# Patient Record
Sex: Female | Born: 1980
Health system: Southern US, Community
[De-identification: ages and names within clinical notes are randomized; demographics above are authoritative.]

## PROBLEM LIST (undated history)

## (undated) ENCOUNTER — Inpatient Hospital Stay (HOSPITAL_COMMUNITY): Payer: Self-pay

## (undated) DIAGNOSIS — Z789 Other specified health status: Secondary | ICD-10-CM

## (undated) HISTORY — DX: Other specified health status: Z78.9

## (undated) HISTORY — PX: NO PAST SURGERIES: SHX2092

---

## 2004-06-06 ENCOUNTER — Other Ambulatory Visit: Admission: RE | Admit: 2004-06-06 | Discharge: 2004-06-06 | Payer: Self-pay | Admitting: Obstetrics and Gynecology

## 2005-04-30 ENCOUNTER — Other Ambulatory Visit: Admission: RE | Admit: 2005-04-30 | Discharge: 2005-04-30 | Payer: Self-pay | Admitting: Obstetrics and Gynecology

## 2007-08-26 ENCOUNTER — Ambulatory Visit (HOSPITAL_COMMUNITY): Admission: RE | Admit: 2007-08-26 | Discharge: 2007-08-26 | Payer: Self-pay | Admitting: Obstetrics and Gynecology

## 2007-11-17 ENCOUNTER — Inpatient Hospital Stay (HOSPITAL_COMMUNITY): Admission: AD | Admit: 2007-11-17 | Discharge: 2007-11-19 | Payer: Self-pay | Admitting: Obstetrics and Gynecology

## 2009-11-29 IMAGING — US US OB DETAIL+14 WK
1 series · 18 of 28 positions shown · non-contrast
Comparison: none

OBSTETRICAL ULTRASOUND:
 This ultrasound was performed in The [HOSPITAL], and the [facility] report will be stored to [REDACTED] PACS.

[Series 1: us ob detail+14 wk · 18 of 64 slices shown]
[im 1/64]
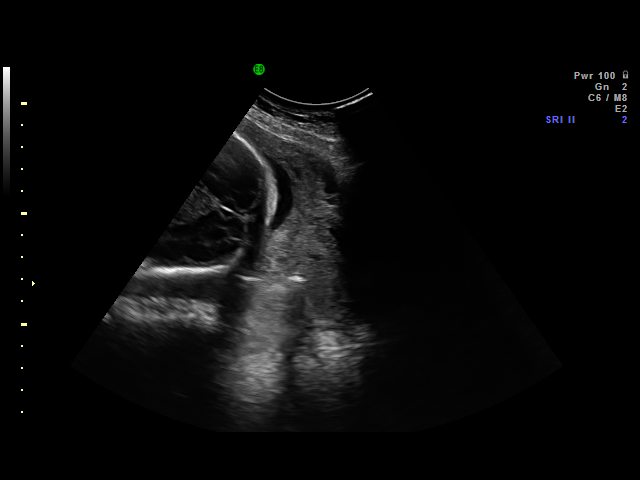
[im 5/64]
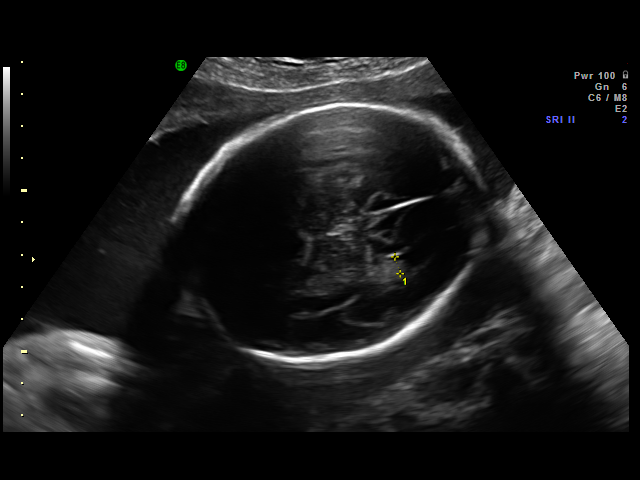
[im 8/64]
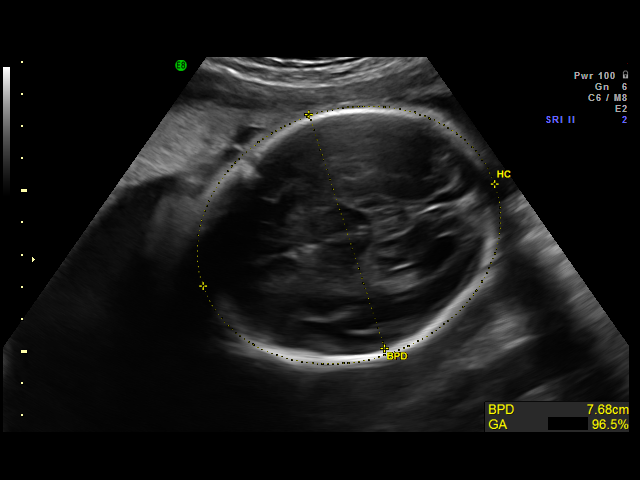
[im 12/64]
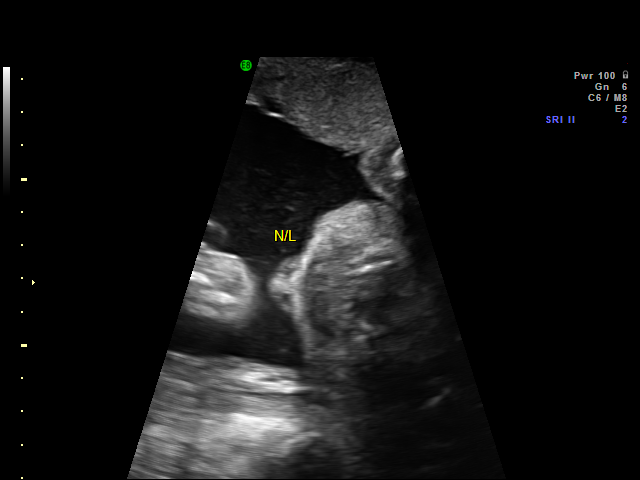
[im 17/64]
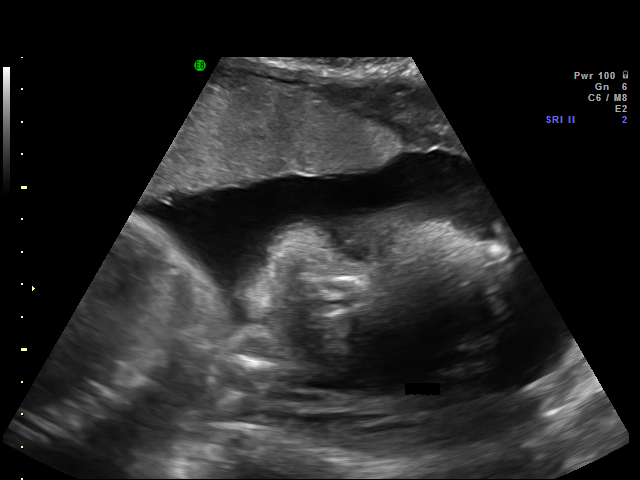
[im 19/64]
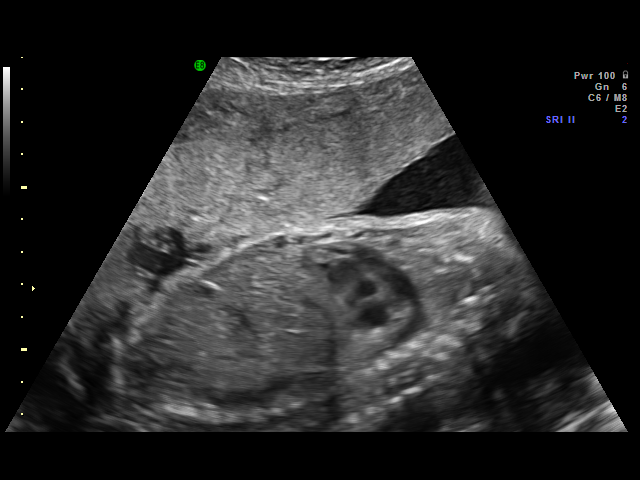
[im 24/64]
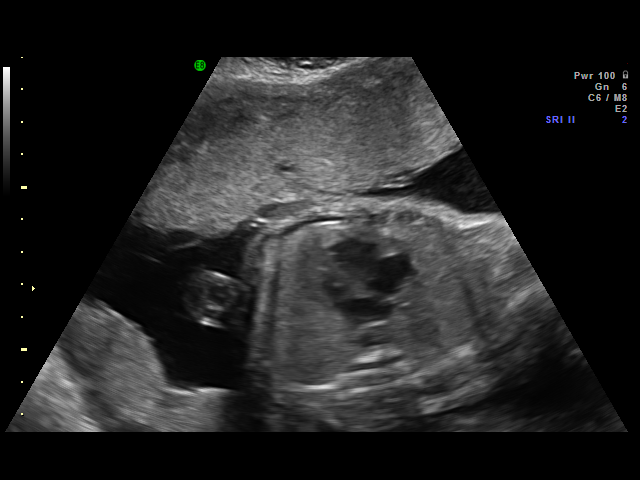
[im 26/64]
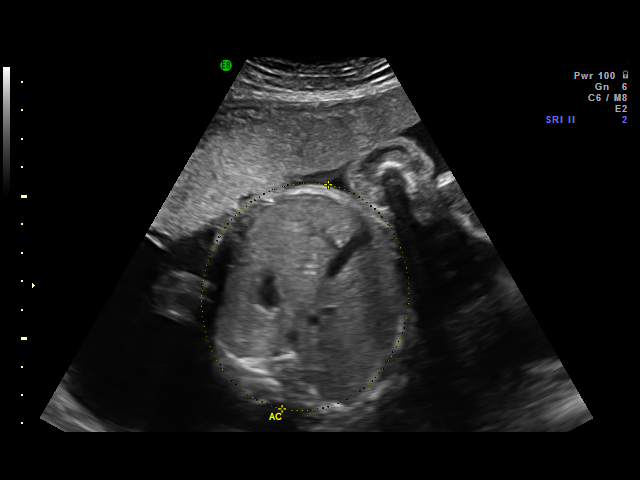
[im 31/64]
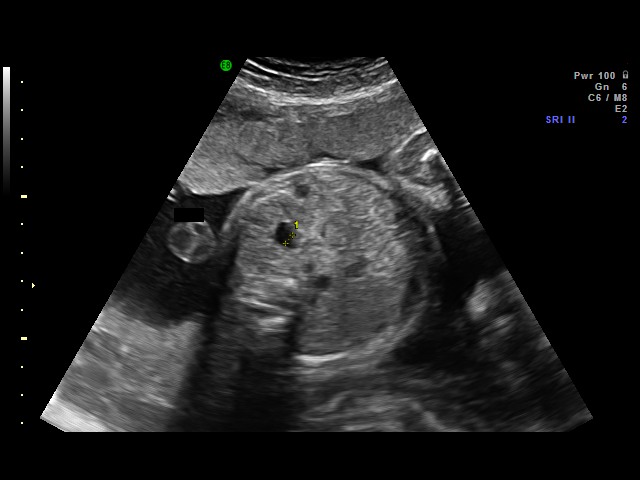
[im 33/64]
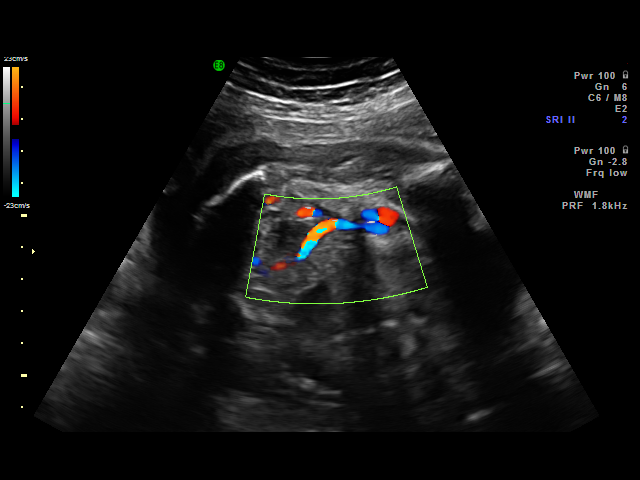
[im 38/64]
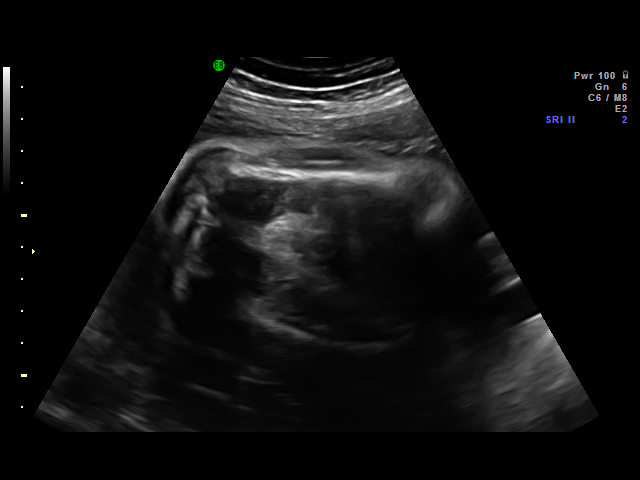
[im 40/64]
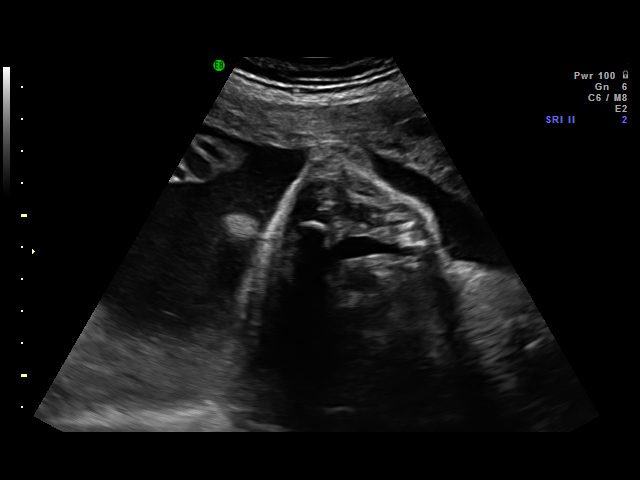
[im 45/64]
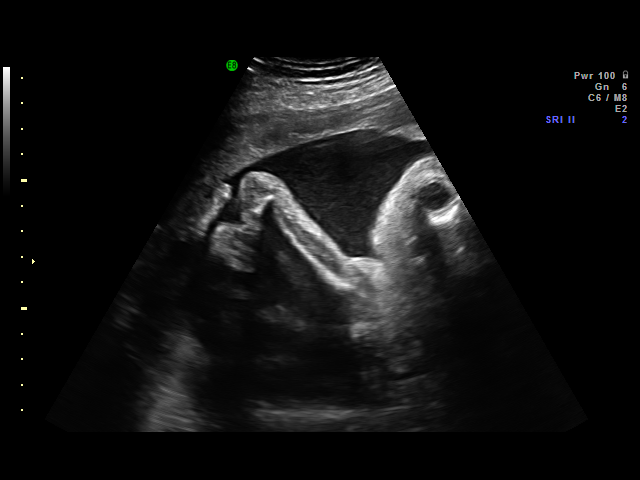
[im 50/64]
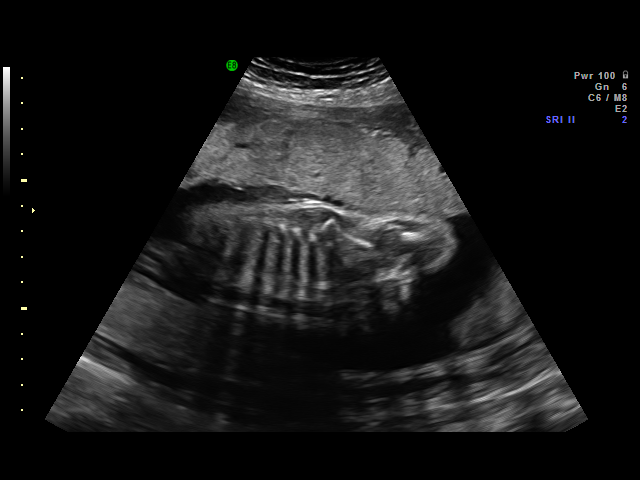
[im 52/64]
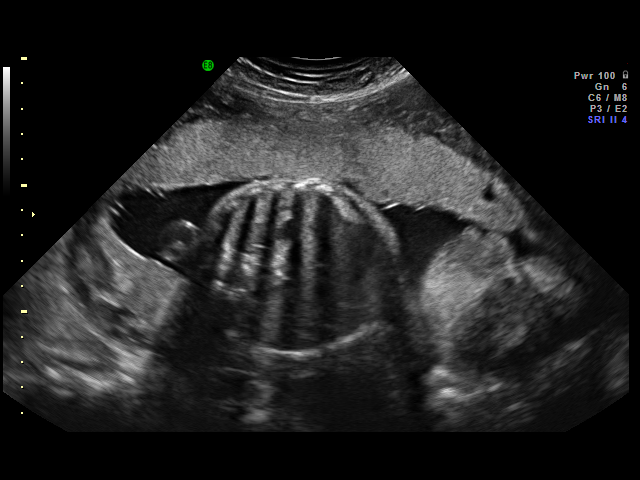
[im 57/64]
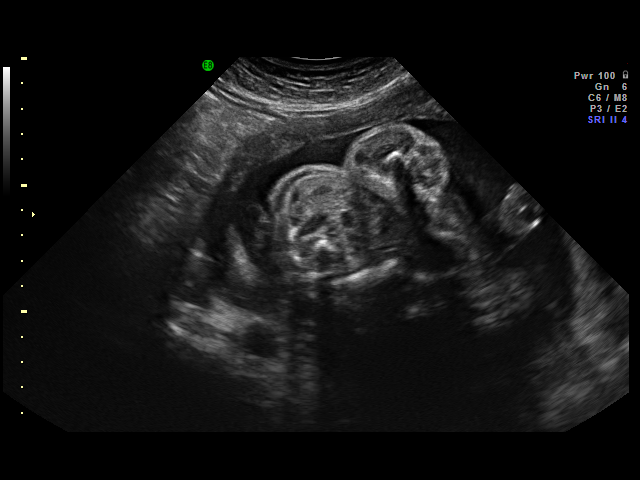
[im 59/64]
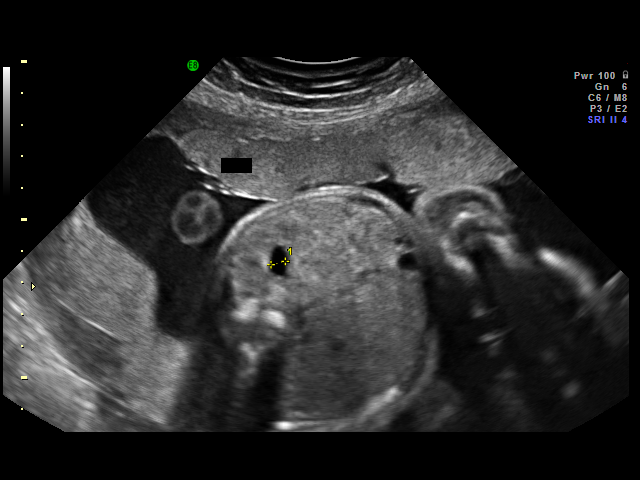
[im 64/64]
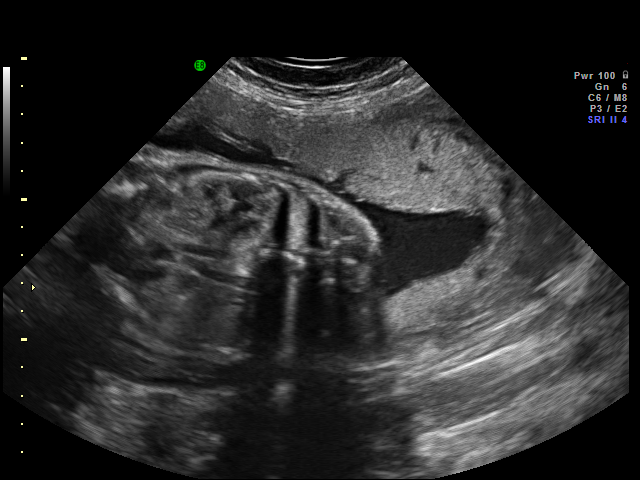

[18 of 28 positions shown; findings below may reference images not displayed]

IMPRESSION: [facility] has also been faxed to the ordering physician.

## 2010-08-01 NOTE — Discharge Summary (Signed)
NAMEBREANNAH, Paula Wilkins                   ACCOUNT NO.:  [id number]   MEDICAL RECORD NO.:  [id number]          PATIENT TYPE:  INP   LOCATION:  9124                          FACILITY:  [facility]   PHYSICIAN:  Malachi Pro. Ambrose Mantle, M.D. DATE OF BIRTH:  [birth year]   DATE OF ADMISSION:  11/17/2007  DATE OF DISCHARGE:  11/19/2007                               DISCHARGE SUMMARY   HISTORY OF PRESENT ILLNESS:  A 30 year old white married female para 0,  gravida 1, [facility] November 16, 2007, admitted with premature rupture of the  membranes.  Blood group and type O positive, negative antibody,  nonreactive serology, rubella immune, hepatitis B surface antigen  negative, HIV negative, GC and chlamydia negative, 1-hour Glucola 129,  group B strep negative, AFP negative.  Ultrasound on March 31, 2007,  crown-rump length 9.4 mm, 7 weeks 0 days, [facility] November 17, 2007.  Ultrasound on April 09, 2007, crown-rump length 19.1 mm, 8 weeks 3  days, [facility] November 16, 2007.  Ultrasound on June 23, 2007, average  gestational age [redacted] weeks 3 days, [facility] November 14, 2007.  Right kidney  could not be seen.  Left kidney had mild dilatation of the collecting  system.  Ultrasound on Jul 25, 2007 showed a left renal pelvis that was  upper limit of normal, right kidney hard to see, questionable  dysplastic.  [facility]ound on August 26, 2007 [facility] showed  the left kidney to be normal and the right kidney was absent.  Ultrasound on November 03, 2007 showed normal growth, succenturiate lobe  of the placenta.  The patient had spontaneous rupture of membranes on  the morning of admission, no regular contractions.   PAST MEDICAL HISTORY:  Showed no known allergies.  No operations.  No  illnesses.   FAMILY HISTORY:  Maternal grandfather with COPD, paternal uncle colon  cancer.   SOCIAL HISTORY:  Alcohol, tobacco, and drugs, none.   OBSTETRIC HISTORY:  None.   PHYSICAL EXAMINATION:  On admission revealed normal vital signs.  Heart  and  lungs were normal.  The abdomen was soft.  Fetal heart tones were  normal.  Cervix by the nurses' exam was 1-2 cm, 50% vertex at -2 to -3  with clear fluid.  The patient was begun on Pitocin.  She was observed  for progress.  At 1:35 p.m., the cervix was 4 cm, 95% vertex at -2.  At  6:07 p.m., she was 10 cm at +1 station.  She began pushing at  approximately 6:50 p.m.  She pushed well, but descent was slow.  At 7:47  p.m., there was 2-3 cm caput visible with pushing.  The vertex felt LOT  to LOA.  Contractions remained every 2-3 minutes on 13 milliunits a  minute of Pitocin.  She tried pulling on her thighs, on a sheet and on  handles.  She liked the handles best.  Fetal heart rate was normal until  approximately 7:20 p.m., when she began having some decelerations, some  U-shaped after contractions, but at 8:40 p.m., the fetal heart rate was  reassuring.  After 3 hours of pushing, she was placed in stirrups.  She  was able to show 5-6 cm of caput with pushing.  A MultiVac vacuum was  applied with the vertex LOT to LOA and with 2 contractions and no  popoffs.  A living female infant 7 pounds 15 ounces with Apgars of 4 at  one and 9 at five minutes was delivered LOA over bilateral labial deep  left sulcus and second-degree midline lacerations.  Meconium was present  and the DeLee and bulb suction were done with the head on the perineum.  There was no shoulder dystocia.  All lacerations were repaired with 3-0  Vicryl.  A hematoma in the right distal vagina was opened and drained.  Blood loss about 500 mL.  Rectal was negative.  Post partum, the patient  did well and was discharged on the second postpartum day.  Initial  hemoglobin 14.6, hematocrit 43.5, white count 13,300, and platelet count  188,000.  RPR nonreactive.  Followup hemoglobin 11.9, hematocrit 35.1,  and white count 27,900.   FINAL DIAGNOSES:  Intrauterine pregnancy at 40 weeks and 1 day with  premature rupture of membranes,  delivered LOA with vacuum assistance,  prolonged second stage of labor.   OPERATION:  Vacuum-assisted vaginal delivery left occipitoanterior,  repair of bilateral labial, deep left sulcus, and second-degree midline  lacerations.   FINAL CONDITION:  Improved.   INSTRUCTIONS:  Include a regular discharge instruction booklet.  The  patient is advised to return in 6 weeks.  Percocet and Motrin are given  at discharge, 30 tablets of each.      Malachi Pro. Ambrose Mantle, M.D.  Electronically Signed     TFH/MEDQ  D:  11/19/2007  T:  11/20/2007  Job:  [id number]

## 2010-12-20 LAB — CBC
HCT: 35.1 — ABNORMAL LOW
MCV: 89.3
RBC: 3.93
WBC: 27.9 — ABNORMAL HIGH

## 2013-12-26 ENCOUNTER — Telehealth: Payer: Self-pay | Admitting: Nurse Practitioner

## 2013-12-26 DIAGNOSIS — J0111 Acute recurrent frontal sinusitis: Secondary | ICD-10-CM

## 2013-12-26 MED ORDER — AMOXICILLIN-POT CLAVULANATE 875-125 MG PO TABS: 1.0000 | ORAL_TABLET | Freq: Two times a day (BID) | ORAL | Status: DC

## 2013-12-26 NOTE — Progress Notes (Signed)

## 2014-01-01 ENCOUNTER — Other Ambulatory Visit: Payer: Self-pay

## 2014-01-31 ENCOUNTER — Telehealth: Payer: Self-pay | Admitting: Physician Assistant

## 2014-01-31 DIAGNOSIS — J019 Acute sinusitis, unspecified: Principal | ICD-10-CM

## 2014-01-31 DIAGNOSIS — B9689 Other specified bacterial agents as the cause of diseases classified elsewhere: Secondary | ICD-10-CM

## 2014-01-31 MED ORDER — AMOXICILLIN-POT CLAVULANATE 875-125 MG PO TABS: 1.0000 | ORAL_TABLET | Freq: Two times a day (BID) | ORAL | Status: DC

## 2014-01-31 NOTE — Progress Notes (Signed)
We are sorry that you are not feeling well.  Here is how we plan to help!  Based on what you have shared with me it looks like you have sinusitis.  Sinusitis is inflammation and infection in the sinus cavities of the head.  Based on your presentation I believe you most likely have Acute Bacterial sinusitis.  This is an infection caused by bacteria and is treated with antibiotics.  I have prescribed Augmentin, an antibiotic in the penicillin family, one tablet twice daily with food, for 10 days.  You may use an oral decongestant such as Mucinex D or if you have glaucoma or high blood pressure use plain Mucinex.  Saline nasal sprays help an can sefely be used as often as needed for congestion.  If you develop worsening sinus pain, fever or notice severe headache and vision changes, or if symptoms are not better after completion of antibiotic, please schedule an appointment with a health care provider.  Sinus infections are not as easily transmitted as other respiratory infection, however we still recommend that you avoid close contact with loved ones, especially the very young and elderly.  Remember to wash your hands thoroughly throughout the day as this is the number one way to prevent the spread of infection!  Home Care:  Only take medications as instructed by your medical team.  Complete the entire course of an antibiotic.  Do not take these medications with alcohol.  A steam or ultrasonic humidifier can help congestion.  You can place a towel over your head and breathe in the steam from hot water coming from a faucet.  Avoid close contacts especially the very young and the elderly.  Cover your mouth when you cough or sneeze.  Always remember to wash your hands.  Get Help Right Away If:  You develop worsening fever or sinus pain.  You develop a severe head ache or visual changes.  Your symptoms persist after you have completed your treatment plan.  Make sure you  Understand these  instructions.  Will watch your condition.  Will get help right away if you are not doing well or get worse.  Your e-visit answers were reviewed by a board certified advanced clinical practitioner to complete your personal care plan.  Depending on the condition, your plan could have included both over the counter or prescription medications.  Please review your pharmacy choice.  If there is a problem, you may call our nursing hot line at [phone] and have the prescription routed to another pharmacy.  Your safety is important to [place].  If you have drug allergies check your prescription carefully.    You can use MyChart to ask questions about today's visit, request a non-urgent call back, or ask for a work or school excuse.  You will get an e-mail in the next two days asking about your experience.  I hope that your e-visit has been valuable and will speed your recovery. Thank you for using e-visits.

## 2014-02-02 ENCOUNTER — Emergency Department (HOSPITAL_COMMUNITY)
Admission: EM | Admit: 2014-02-02 | Discharge: 2014-02-02 | Disposition: A | Discharge status: Home / Self Care | Payer: 59 | Source: Home / Self Care | Attending: Emergency Medicine | Admitting: Emergency Medicine

## 2014-02-02 ENCOUNTER — Encounter (HOSPITAL_COMMUNITY): Payer: Self-pay | Admitting: Emergency Medicine

## 2014-02-02 DIAGNOSIS — J029 Acute pharyngitis, unspecified: Secondary | ICD-10-CM

## 2014-02-02 LAB — POCT RAPID STREP A: Streptococcus, Group A Screen (Direct): NEGATIVE

## 2014-02-02 LAB — POCT INFECTIOUS MONO SCREEN: MONO SCREEN: NEGATIVE

## 2014-02-02 MED ORDER — CLINDAMYCIN HCL 300 MG PO CAPS: 300.0000 mg | ORAL_CAPSULE | Freq: Four times a day (QID) | ORAL | Status: DC

## 2014-02-02 MED ORDER — PREDNISONE 20 MG PO TABS: 20.0000 mg | ORAL_TABLET | Freq: Two times a day (BID) | ORAL | Status: DC

## 2014-02-02 MED ORDER — OSELTAMIVIR PHOSPHATE 75 MG PO CAPS: 75.0000 mg | ORAL_CAPSULE | Freq: Two times a day (BID) | ORAL | Status: DC

## 2014-02-02 NOTE — Discharge Instructions (Signed)

## 2014-02-02 NOTE — ED Provider Notes (Signed)
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 She denies any headache, cough, or GI symptoms. Her son was recently diagnosed as having infectious mononucleosis. She did an E visit through My Chart this past Saturday, 3 days ago, and was prescribed Augmentin, but does not feel any better.   Review of Systems   Other than as noted above, the patient denies any of the following symptoms. Systemic:  No fever, chills, sweats, myalgias, or headache. Eye:  No redness, pain or drainage. ENT:  No earache, nasal congestion, sneezing, rhinorrhea, sinus pressure, sinus pain, or post nasal drip. Lungs:  No cough, sputum production, wheezing, shortness of breath, or chest pain. GI:  No abdominal pain, nausea, vomiting, or diarrhea. Skin:  No rash.  [place]   Past medical history, family history, social history, meds, and allergies were reviewed.   Physical Exam     Vital signs:  BP 118/76 mmHg  Pulse 113  Temp(Src) 101.6 F (38.7 C) (Oral)  Resp 12  SpO2 100%  LMP 12/29/2013 General:  Alert, in no distress. Phonation was normal, no drooling, and patient was able to handle secretions well.  Eye:  No conjunctival injection or drainage. Lids were normal. ENT:  TMs and canals were normal, without erythema or inflammation.  Nasal mucosa was clear and uncongested, without drainage.  Mucous membranes were moist.  Exam of pharynx shows posterior pharyngeal erythema and some cobblestoning, without exudate or drainage.  There were no oral ulcerations or lesions. There was no bulging of the tonsillar pillars, and the uvula was midline. Neck:  Supple, no adenopathy, tenderness or mass. Lungs:  No respiratory distress.  Lungs were clear to auscultation, without wheezes, rales or rhonchi.  Breath sounds were  clear and equal bilaterally.  Heart:  Regular rhythm, without gallops, murmers or rubs. Skin:  Clear, warm, and dry, without rash or lesions.  Labs   Results for orders placed or performed during the hospital encounter of 02/02/14  POCT rapid strep A [facility])  Result Value Ref Range   Streptococcus, Group A Screen (Direct) NEGATIVE NEGATIVE  Infectious mono screen, POC  Result Value Ref Range   Mono Screen NEGATIVE NEGATIVE   Assessment   The encounter diagnosis was Pharyngitis.  There is no evidence of a peritonsillar abscess, retropharyngeal abscess, or epiglottitis.    Differential diagnosis includes anaerobic bacterial infection, mononucleosis, or influenza.  Plan     1.  Meds:  The following meds were prescribed:   Discharge Medication List as of 02/02/2014  8:01 PM    START taking these medications   Details  clindamycin (CLEOCIN) 300 MG capsule Take 1 capsule (300 mg total) by mouth 4 (four) times daily., Starting 02/02/2014, Until Discontinued, Normal    oseltamivir (TAMIFLU) 75 MG capsule Take 1 capsule (75 mg total) by mouth every 12 (twelve) hours., Starting 02/02/2014, Until Discontinued, Normal    predniSONE (DELTASONE) 20 MG tablet Take 1 tablet (20 mg total) by mouth 2 (two) times daily., Starting 02/02/2014, Until Discontinued, Normal        2.  Patient Education/Counseling:  The patient was given appropriate handouts, self care instructions, and instructed in symptomatic relief, including hot saline gargles, throat lozenges, infectious precautions, and need to trade out toothbrush.    3.  Follow up:  The patient was told to follow up here if no better in 3 to 4 days, or sooner if becoming worse in any way, and given some red flag symptoms such as difficulty swallowing or breathing which would prompt immediate return.       Harden Mo, MD 02/02/14 2033

## 2014-02-02 NOTE — ED Notes (Signed)
Reports having fever and sore throat.  States symptoms gradually getting worse.  No relief with mucinex and Augmentin.   Denies vomiting and diarrhea.  Symptoms present since Thursday.

## 2014-02-04 LAB — CULTURE, GROUP A STREP

## 2014-04-18 ENCOUNTER — Telehealth: Payer: 59 | Admitting: Physician Assistant

## 2014-04-18 DIAGNOSIS — J019 Acute sinusitis, unspecified: Principal | ICD-10-CM

## 2014-04-18 DIAGNOSIS — B9689 Other specified bacterial agents as the cause of diseases classified elsewhere: Secondary | ICD-10-CM

## 2014-04-18 MED ORDER — AMOXICILLIN-POT CLAVULANATE 875-125 MG PO TABS: 1.0000 | ORAL_TABLET | Freq: Two times a day (BID) | ORAL | Status: DC

## 2014-04-18 NOTE — Progress Notes (Signed)

## 2014-04-18 NOTE — Progress Notes (Signed)
Duplicate encounter.  Do not charge.

## 2014-05-06 ENCOUNTER — Telehealth: Payer: 59 | Admitting: Physician Assistant

## 2014-05-06 DIAGNOSIS — J019 Acute sinusitis, unspecified: Principal | ICD-10-CM

## 2014-05-06 DIAGNOSIS — B9689 Other specified bacterial agents as the cause of diseases classified elsewhere: Secondary | ICD-10-CM

## 2014-05-06 MED ORDER — DOXYCYCLINE HYCLATE 100 MG PO CAPS: 100.0000 mg | ORAL_CAPSULE | Freq: Two times a day (BID) | ORAL | Status: DC

## 2014-05-06 NOTE — Progress Notes (Signed)
We are sorry that you are not feeling well.  Here is how we plan to help!  Based on what you have shared with me it looks like you have sinusitis.  Sinusitis is inflammation and infection in the sinus cavities of the head.  Based on your presentation I believe you most likely have Acute Bacterial sinusitis.  This is an infection caused by bacteria and is treated with antibiotics.  I have prescribed Doxycycline an antibiotic, one tablet twice daily with food, for 10 days.  You may use an oral decongestant such as Mucinex D or if you have glaucoma or high blood pressure use plain Mucinex.  Saline nasal sprays help an can sefely be used as often as needed for congestion.  If you develop worsening sinus pain, fever or notice severe headache and vision changes, or if symptoms are not better after completion of antibiotic, please schedule an appointment with a health care provider.  Sinus infections are not as easily transmitted as other respiratory infection, however we still recommend that you avoid close contact with loved ones, especially the very young and elderly.  Remember to wash your hands thoroughly throughout the day as this is the number one way to prevent the spread of infection!  Home Care:  Only take medications as instructed by your medical team.  Complete the entire course of an antibiotic.  Do not take these medications with alcohol.  A steam or ultrasonic humidifier can help congestion.  You can place a towel over your head and breathe in the steam from hot water coming from a faucet.  Avoid close contacts especially the very young and the elderly.  Cover your mouth when you cough or sneeze.  Always remember to wash your hands.  Get Help Right Away If:  You develop worsening fever or sinus pain.  You develop a severe head ache or visual changes.  Your symptoms persist after you have completed your treatment plan.  Make sure you  Understand these instructions.  Will  watch your condition.  Will get help right away if you are not doing well or get worse.  Your e-visit answers were reviewed by a board certified advanced clinical practitioner to complete your personal care plan.  Depending on the condition, your plan could have included both over the counter or prescription medications.  If there is a problem please reply  once you have received a response from your provider.  Your safety is important to [place].  If you have drug allergies check your prescription carefully.    You can use MyChart to ask questions about today's visit, request a non-urgent call back, or ask for a work or school excuse.  You will get an e-mail in the next two days asking about your experience.  I hope that your e-visit has been valuable and will speed your recovery. Thank you for using e-visits.

## 2014-07-13 ENCOUNTER — Telehealth (INDEPENDENT_AMBULATORY_CARE_PROVIDER_SITE_OTHER): Payer: Self-pay

## 2014-07-13 ENCOUNTER — Telehealth: Payer: Self-pay | Admitting: Nurse Practitioner

## 2014-07-13 DIAGNOSIS — J0100 Acute maxillary sinusitis, unspecified: Secondary | ICD-10-CM

## 2014-07-13 DIAGNOSIS — J029 Acute pharyngitis, unspecified: Secondary | ICD-10-CM

## 2014-07-13 MED ORDER — CEFDINIR 300 MG PO CAPS: 300.0000 mg | ORAL_CAPSULE | Freq: Two times a day (BID) | ORAL | Status: DC

## 2014-07-13 NOTE — Progress Notes (Signed)
We are sorry that you are not feeling well.  Here is how we plan to help!  Based on what you have shared with me it looks like you have sinusitis.  Sinusitis is inflammation and infection in the sinus cavities of the head.  Based on your presentation I believe you most likely have Acute Bacterial sinusitis.  This is an infection caused by bacteria and is treated with antibiotics.  I have prescribed omnicef 300mg  1 ID. You may use an oral decongestant such as Mucinex D or if you have glaucoma or high blood pressure use plain Mucinex.  Saline nasal sprays help and can safely be used as often as needed for congestion. If you develop worsening sinus pain, fever or notice severe headache and vision changes, or if symptoms are not better after completion of antibiotic, please schedule an appointment with a health care provider.  Sinus infections are not as easily transmitted as other respiratory infection, however we still recommend that you avoid close contact with loved ones, especially the very young and elderly.  Remember to wash your hands thoroughly throughout the day as this is the number one way to prevent the spread of infection!  Home Care:  Only take medications as instructed by your medical team.  Complete the entire course of an antibiotic.  Do not take these medications with alcohol.  A steam or ultrasonic humidifier can help congestion.  You can place a towel over your head and breathe in the steam from hot water coming from a faucet.  Avoid close contacts especially the very young and the elderly.  Cover your mouth when you cough or sneeze.  Always remember to wash your hands.  Get Help Right Away If:  You develop worsening fever or sinus pain.  You develop a severe head ache or visual changes.  Your symptoms persist after you have completed your treatment plan.  Make sure you  Understand these instructions.  Will watch your condition.  Will get help right away if you  are not doing well or get worse.  Your e-visit answers were reviewed by a board certified advanced clinical practitioner to complete your personal care plan.  Depending on the condition, your plan could have included both over the counter or prescription medications.  If there is a problem please reply  once you have received a response from your provider.  Your safety is important to [place].  If you have drug allergies check your prescription carefully.    You can use MyChart to ask questions about today's visit, request a non-urgent call back, or ask for a work or school excuse.  You will get an e-mail in the next two days asking about your experience.  I hope that your e-visit has been valuable and will speed your recovery. Thank you for using e-visits.

## 2015-02-04 ENCOUNTER — Telehealth: Payer: 59 | Admitting: Nurse Practitioner

## 2015-02-04 DIAGNOSIS — J0101 Acute recurrent maxillary sinusitis: Secondary | ICD-10-CM | POA: Diagnosis not present

## 2015-02-04 MED ORDER — AMOXICILLIN-POT CLAVULANATE 875-125 MG PO TABS: 1.0000 | ORAL_TABLET | Freq: Two times a day (BID) | ORAL | Status: DC

## 2015-02-04 NOTE — Progress Notes (Signed)
We are sorry that you are not feeling well.  Here is how we plan to help!  Based on what you have shared with me it looks like you have sinusitis.  Sinusitis is inflammation and infection in the sinus cavities of the head.  Based on your presentation I believe you most likely have Acute Bacterial Sinusitis.  This is an infection caused by bacteria and is treated with antibiotics. I have prescribed Augmentin, an antibiotic in the penicillin family, one tablet twice daily with food, for 7 days. You may use an oral decongestant such as Mucinex D or if you have glaucoma or high blood pressure use plain Mucinex. Saline nasal spray help and can safely be used as often as needed for congestion.  If you develop worsening sinus pain, fever or notice severe headache and vision changes, or if symptoms are not better after completion of antibiotic, please schedule an appointment with a health care provider.    Sinus infections are not as easily transmitted as other respiratory infection, however we still recommend that you avoid close contact with loved ones, especially the very young and elderly.  Remember to wash your hands thoroughly throughout the day as this is the number one way to prevent the spread of infection!  Home Care:  Only take medications as instructed by your medical team.  Complete the entire course of an antibiotic.  Do not take these medications with alcohol.  A steam or ultrasonic humidifier can help congestion.  You can place a towel over your head and breathe in the steam from hot water coming from a faucet.  Avoid close contacts especially the very young and the elderly.  Cover your mouth when you cough or sneeze.  Always remember to wash your hands.  Get Help Right Away If:  You develop worsening fever or sinus pain.  You develop a severe head ache or visual changes.  Your symptoms persist after you have completed your treatment plan.  Make sure you  Understand these  instructions.  Will watch your condition.  Will get help right away if you are not doing well or get worse.  Your e-visit answers were reviewed by a board certified advanced clinical practitioner to complete your personal care plan.  Depending on the condition, your plan could have included both over the counter or prescription medications.  If there is a problem please reply  once you have received a response from your provider.  Your safety is important to [place].  If you have drug allergies check your prescription carefully.    You can use MyChart to ask questions about today's visit, request a non-urgent call back, or ask for a work or school excuse for 24 hours related to this e-Visit. If it has been greater than 24 hours you will need to follow up with your provider, or enter a new e-Visit to address those concerns.  You will get an e-mail in the next two days asking about your experience.  I hope that your e-visit has been valuable and will speed your recovery. Thank you for using e-visits.

## 2015-04-26 ENCOUNTER — Telehealth: Payer: 59 | Admitting: Family

## 2015-04-26 DIAGNOSIS — J019 Acute sinusitis, unspecified: Secondary | ICD-10-CM | POA: Diagnosis not present

## 2015-04-26 DIAGNOSIS — B9689 Other specified bacterial agents as the cause of diseases classified elsewhere: Secondary | ICD-10-CM

## 2015-04-26 MED ORDER — LEVOFLOXACIN 500 MG PO TABS: 500.0000 mg | ORAL_TABLET | Freq: Every day | ORAL | Status: DC

## 2015-04-26 MED FILL — levoFLOXacin 500 MG TABS: 500 | 7 days supply | Qty: 7 | Fill #0

## 2015-04-26 NOTE — Progress Notes (Signed)

## 2015-06-08 MED FILL — DROSPIRENONE-EE 3-0.03 MG T: 3-0.03 | 84 days supply | Qty: 84 | Fill #3

## 2015-06-27 DIAGNOSIS — L570 Actinic keratosis: Secondary | ICD-10-CM | POA: Diagnosis not present

## 2015-07-19 ENCOUNTER — Telehealth: Payer: 59 | Admitting: Family

## 2015-07-19 DIAGNOSIS — J019 Acute sinusitis, unspecified: Secondary | ICD-10-CM | POA: Diagnosis not present

## 2015-07-19 DIAGNOSIS — B9689 Other specified bacterial agents as the cause of diseases classified elsewhere: Secondary | ICD-10-CM

## 2015-07-19 MED ORDER — LEVOFLOXACIN 500 MG PO TABS: 500.0000 mg | ORAL_TABLET | Freq: Every day | ORAL | Status: DC

## 2015-07-19 MED FILL — levoFLOXacin 500 MG TABS: 500 | 7 days supply | Qty: 7 | Fill #0

## 2015-07-19 NOTE — Progress Notes (Signed)
We are sorry that you are not feeling well.  Here is how we plan to help!  YOU HAVE BEEN TREATED FOR SINUSITIS 5 TIMES IN THE LAST YEAR. PLEASE SCHEDULE AN OFFICE VISIT IF THIS RETURNS AGAIN THIS YEAR  Based on what you have shared with me it looks like you have sinusitis.  Sinusitis is inflammation and infection in the sinus cavities of the head.  Based on your presentation I believe you most likely have Acute Bacterial Sinusitis.  This is an infection caused by bacteria and is treated with antibiotics. I have prescribed Levofloxicin 500mg  by mouth once daily for 7 days. You may use an oral decongestant such as Mucinex D or if you have glaucoma or high blood pressure use plain Mucinex. Saline nasal spray help and can safely be used as often as needed for congestion.  If you develop worsening sinus pain, fever or notice severe headache and vision changes, or if symptoms are not better after completion of antibiotic, please schedule an appointment with a health care provider.    Sinus infections are not as easily transmitted as other respiratory infection, however we still recommend that you avoid close contact with loved ones, especially the very young and elderly.  Remember to wash your hands thoroughly throughout the day as this is the number one way to prevent the spread of infection!  Home Care:  Only take medications as instructed by your medical team.  Complete the entire course of an antibiotic.  Do not take these medications with alcohol.  A steam or ultrasonic humidifier can help congestion.  You can place a towel over your head and breathe in the steam from hot water coming from a faucet.  Avoid close contacts especially the very young and the elderly.  Cover your mouth when you cough or sneeze.  Always remember to wash your hands.  Get Help Right Away If:  You develop worsening fever or sinus pain.  You develop a severe head ache or visual changes.  Your symptoms persist  after you have completed your treatment plan.  Make sure you  Understand these instructions.  Will watch your condition.  Will get help right away if you are not doing well or get worse.  Your e-visit answers were reviewed by a board certified advanced clinical practitioner to complete your personal care plan.  Depending on the condition, your plan could have included both over the counter or prescription medications.  If there is a problem please reply  once you have received a response from your provider.  Your safety is important to [place].  If you have drug allergies check your prescription carefully.    You can use MyChart to ask questions about today's visit, request a non-urgent call back, or ask for a work or school excuse for 24 hours related to this e-Visit. If it has been greater than 24 hours you will need to follow up with your provider, or enter a new e-Visit to address those concerns.  You will get an e-mail in the next two days asking about your experience.  I hope that your e-visit has been valuable and will speed your recovery. Thank you for using e-visits.

## 2015-08-10 DIAGNOSIS — L570 Actinic keratosis: Secondary | ICD-10-CM | POA: Diagnosis not present

## 2015-08-10 DIAGNOSIS — D224 Melanocytic nevi of scalp and neck: Secondary | ICD-10-CM | POA: Diagnosis not present

## 2015-08-10 DIAGNOSIS — D2272 Melanocytic nevi of left lower limb, including hip: Secondary | ICD-10-CM | POA: Diagnosis not present

## 2015-08-10 DIAGNOSIS — D225 Melanocytic nevi of trunk: Secondary | ICD-10-CM | POA: Diagnosis not present

## 2015-08-22 MED FILL — DROSPIRENONE-EE 3-0.03 MG T: 3-0.03 | 84 days supply | Qty: 84 | Fill #4

## 2015-09-16 DIAGNOSIS — Z6821 Body mass index (BMI) 21.0-21.9, adult: Secondary | ICD-10-CM | POA: Diagnosis not present

## 2015-09-16 DIAGNOSIS — Z01419 Encounter for gynecological examination (general) (routine) without abnormal findings: Secondary | ICD-10-CM | POA: Diagnosis not present

## 2015-09-16 DIAGNOSIS — Z793 Long term (current) use of hormonal contraceptives: Secondary | ICD-10-CM | POA: Diagnosis not present

## 2015-09-16 DIAGNOSIS — Z1389 Encounter for screening for other disorder: Secondary | ICD-10-CM | POA: Diagnosis not present

## 2016-01-27 ENCOUNTER — Encounter: Payer: Self-pay | Admitting: Family Medicine

## 2016-01-27 ENCOUNTER — Ambulatory Visit: Payer: 59

## 2016-01-27 DIAGNOSIS — Z3201 Encounter for pregnancy test, result positive: Secondary | ICD-10-CM

## 2016-01-27 LAB — POCT PREGNANCY, URINE: Preg Test, Ur: POSITIVE — AB

## 2016-01-27 NOTE — Progress Notes (Signed)
Pt here today for pregnancy test.  Resulted positive.  Pt reports LMP 12/30/15 indicating EDD to be 10/05/16.  Pt provided with proof of pregnancy letter and reports starting prenatal care at [facility].

## 2016-02-03 DIAGNOSIS — Z3201 Encounter for pregnancy test, result positive: Secondary | ICD-10-CM | POA: Diagnosis not present

## 2016-02-06 DIAGNOSIS — O200 Threatened abortion: Secondary | ICD-10-CM | POA: Diagnosis not present

## 2016-03-09 ENCOUNTER — Telehealth: Payer: 59 | Admitting: Family

## 2016-03-09 DIAGNOSIS — J019 Acute sinusitis, unspecified: Secondary | ICD-10-CM | POA: Diagnosis not present

## 2016-03-09 MED ORDER — DOXYCYCLINE HYCLATE 100 MG PO TABS: 100.0000 mg | ORAL_TABLET | Freq: Two times a day (BID) | ORAL | 0 refills | Status: DC

## 2016-03-09 NOTE — Progress Notes (Signed)

## 2016-03-19 NOTE — L&D Delivery Note (Signed)
Delivery Note Pt labored to complete quickly after AROM. She then pushed for 4mins and at 2:49 AM a viable female was delivered via  (Presentation:ROA ;  ).  APGAR:9,9 , ; weight pending .   Placenta status: delivered, intact, duncan, .  Cord: 3vc with the following complications: none .    Anesthesia:  Epidural Episiotomy: None Lacerations: 1st degree;Labial;Perineal Suture Repair: 2.0 vicryl 4-0 Est. Blood Loss (mL): 250  Mom to postpartum.  Baby to Couplet care / Skin to Skin  Pt desires circumcision in the hospital.   [place] 12/08/2016, 3:43 AM

## 2016-04-13 DIAGNOSIS — Z3201 Encounter for pregnancy test, result positive: Secondary | ICD-10-CM | POA: Diagnosis not present

## 2016-04-13 DIAGNOSIS — N912 Amenorrhea, unspecified: Secondary | ICD-10-CM | POA: Diagnosis not present

## 2016-05-07 DIAGNOSIS — Z3A09 9 weeks gestation of pregnancy: Secondary | ICD-10-CM | POA: Diagnosis not present

## 2016-05-07 DIAGNOSIS — O09521 Supervision of elderly multigravida, first trimester: Secondary | ICD-10-CM | POA: Diagnosis not present

## 2016-05-07 DIAGNOSIS — O26891 Other specified pregnancy related conditions, first trimester: Secondary | ICD-10-CM | POA: Diagnosis not present

## 2016-05-10 DIAGNOSIS — Z113 Encounter for screening for infections with a predominantly sexual mode of transmission: Secondary | ICD-10-CM | POA: Diagnosis not present

## 2016-05-10 DIAGNOSIS — Z3A09 9 weeks gestation of pregnancy: Secondary | ICD-10-CM | POA: Diagnosis not present

## 2016-05-10 DIAGNOSIS — Z3481 Encounter for supervision of other normal pregnancy, first trimester: Secondary | ICD-10-CM | POA: Diagnosis not present

## 2016-05-10 DIAGNOSIS — O09521 Supervision of elderly multigravida, first trimester: Secondary | ICD-10-CM | POA: Diagnosis not present

## 2016-05-11 DIAGNOSIS — Z1379 Encounter for other screening for genetic and chromosomal anomalies: Secondary | ICD-10-CM | POA: Diagnosis not present

## 2016-05-11 DIAGNOSIS — Z3689 Encounter for other specified antenatal screening: Secondary | ICD-10-CM | POA: Diagnosis not present

## 2016-05-11 DIAGNOSIS — Z368A Encounter for antenatal screening for other genetic defects: Secondary | ICD-10-CM | POA: Diagnosis not present

## 2016-05-11 LAB — OB RESULTS CONSOLE GC/CHLAMYDIA
CHLAMYDIA, DNA PROBE: NEGATIVE
Gonorrhea: NEGATIVE

## 2016-05-11 LAB — OB RESULTS CONSOLE HIV ANTIBODY (ROUTINE TESTING): HIV: NONREACTIVE

## 2016-05-11 LAB — OB RESULTS CONSOLE RPR: RPR: NONREACTIVE

## 2016-05-11 LAB — OB RESULTS CONSOLE HEPATITIS B SURFACE ANTIGEN: HEP B S AG: NEGATIVE

## 2016-05-11 LAB — OB RESULTS CONSOLE RUBELLA ANTIBODY, IGM: Rubella: IMMUNE

## 2016-05-11 LAB — OB RESULTS CONSOLE ANTIBODY SCREEN: ANTIBODY SCREEN: NEGATIVE

## 2016-05-11 LAB — OB RESULTS CONSOLE ABO/RH: RH TYPE: POSITIVE

## 2016-05-31 DIAGNOSIS — O09521 Supervision of elderly multigravida, first trimester: Secondary | ICD-10-CM | POA: Diagnosis not present

## 2016-05-31 DIAGNOSIS — Z3A12 12 weeks gestation of pregnancy: Secondary | ICD-10-CM | POA: Diagnosis not present

## 2016-05-31 DIAGNOSIS — Z3682 Encounter for antenatal screening for nuchal translucency: Secondary | ICD-10-CM | POA: Diagnosis not present

## 2016-06-29 DIAGNOSIS — Z36 Encounter for antenatal screening for chromosomal anomalies: Secondary | ICD-10-CM | POA: Diagnosis not present

## 2016-07-13 DIAGNOSIS — Z3A19 19 weeks gestation of pregnancy: Secondary | ICD-10-CM | POA: Diagnosis not present

## 2016-07-13 DIAGNOSIS — Z363 Encounter for antenatal screening for malformations: Secondary | ICD-10-CM | POA: Diagnosis not present

## 2016-07-13 DIAGNOSIS — O09529 Supervision of elderly multigravida, unspecified trimester: Secondary | ICD-10-CM | POA: Diagnosis not present

## 2016-07-31 ENCOUNTER — Other Ambulatory Visit: Payer: Self-pay | Admitting: Obstetrics and Gynecology

## 2016-07-31 DIAGNOSIS — N63 Unspecified lump in unspecified breast: Secondary | ICD-10-CM

## 2016-08-01 ENCOUNTER — Other Ambulatory Visit: Payer: Self-pay | Admitting: Obstetrics and Gynecology

## 2016-08-01 ENCOUNTER — Ambulatory Visit
Admission: RE | Admit: 2016-08-01 | Discharge: 2016-08-01 | Disposition: A | Discharge status: Home / Self Care | Payer: 59 | Source: Ambulatory Visit | Attending: Obstetrics and Gynecology | Admitting: Obstetrics and Gynecology

## 2016-08-01 DIAGNOSIS — N63 Unspecified lump in unspecified breast: Secondary | ICD-10-CM

## 2016-08-01 DIAGNOSIS — N6489 Other specified disorders of breast: Secondary | ICD-10-CM | POA: Diagnosis not present

## 2016-08-02 ENCOUNTER — Other Ambulatory Visit: Payer: 59

## 2016-08-09 DIAGNOSIS — D225 Melanocytic nevi of trunk: Secondary | ICD-10-CM | POA: Diagnosis not present

## 2016-08-09 DIAGNOSIS — D485 Neoplasm of uncertain behavior of skin: Secondary | ICD-10-CM | POA: Diagnosis not present

## 2016-08-09 DIAGNOSIS — D2361 Other benign neoplasm of skin of right upper limb, including shoulder: Secondary | ICD-10-CM | POA: Diagnosis not present

## 2016-08-09 DIAGNOSIS — D2272 Melanocytic nevi of left lower limb, including hip: Secondary | ICD-10-CM | POA: Diagnosis not present

## 2016-08-09 DIAGNOSIS — D2271 Melanocytic nevi of right lower limb, including hip: Secondary | ICD-10-CM | POA: Diagnosis not present

## 2016-08-09 DIAGNOSIS — D224 Melanocytic nevi of scalp and neck: Secondary | ICD-10-CM | POA: Diagnosis not present

## 2016-08-09 DIAGNOSIS — D2230 Melanocytic nevi of unspecified part of face: Secondary | ICD-10-CM | POA: Diagnosis not present

## 2016-09-10 DIAGNOSIS — Z3689 Encounter for other specified antenatal screening: Secondary | ICD-10-CM | POA: Diagnosis not present

## 2016-09-10 DIAGNOSIS — Z23 Encounter for immunization: Secondary | ICD-10-CM | POA: Diagnosis not present

## 2016-09-10 DIAGNOSIS — Z3A27 27 weeks gestation of pregnancy: Secondary | ICD-10-CM | POA: Diagnosis not present

## 2016-09-10 DIAGNOSIS — O09522 Supervision of elderly multigravida, second trimester: Secondary | ICD-10-CM | POA: Diagnosis not present

## 2016-09-14 DIAGNOSIS — R7309 Other abnormal glucose: Secondary | ICD-10-CM | POA: Diagnosis not present

## 2016-09-19 ENCOUNTER — Inpatient Hospital Stay (HOSPITAL_COMMUNITY): Admission: AD | Admit: 2016-09-19 | Payer: 59 | Source: Ambulatory Visit | Admitting: Obstetrics and Gynecology

## 2016-11-08 LAB — OB RESULTS CONSOLE GBS: STREP GROUP B AG: NEGATIVE

## 2016-11-22 ENCOUNTER — Encounter (HOSPITAL_COMMUNITY): Payer: Self-pay

## 2016-11-22 ENCOUNTER — Inpatient Hospital Stay (HOSPITAL_COMMUNITY)
Admission: AD | Admit: 2016-11-22 | Discharge: 2016-11-22 | Disposition: A | Discharge status: Home / Self Care | Payer: 59 | Source: Ambulatory Visit | Attending: Obstetrics and Gynecology | Admitting: Obstetrics and Gynecology

## 2016-11-22 DIAGNOSIS — O9989 Other specified diseases and conditions complicating pregnancy, childbirth and the puerperium: Secondary | ICD-10-CM | POA: Diagnosis not present

## 2016-11-22 DIAGNOSIS — E860 Dehydration: Secondary | ICD-10-CM | POA: Diagnosis not present

## 2016-11-22 DIAGNOSIS — O479 False labor, unspecified: Secondary | ICD-10-CM

## 2016-11-22 DIAGNOSIS — A084 Viral intestinal infection, unspecified: Secondary | ICD-10-CM | POA: Diagnosis not present

## 2016-11-22 DIAGNOSIS — O212 Late vomiting of pregnancy: Secondary | ICD-10-CM | POA: Diagnosis not present

## 2016-11-22 DIAGNOSIS — Z3A37 37 weeks gestation of pregnancy: Secondary | ICD-10-CM | POA: Diagnosis not present

## 2016-11-22 LAB — URINALYSIS, ROUTINE W REFLEX MICROSCOPIC
BILIRUBIN URINE: NEGATIVE
Glucose, UA: NEGATIVE mg/dL
HGB URINE DIPSTICK: NEGATIVE
Ketones, ur: 80 mg/dL — AB
NITRITE: NEGATIVE
PROTEIN: 30 mg/dL — AB
Specific Gravity, Urine: 1.023 (ref 1.005–1.030)
pH: 5 (ref 5.0–8.0)

## 2016-11-22 MED ORDER — LACTATED RINGERS IV SOLN
INTRAVENOUS | Status: DC
  Administered 2016-11-22: 05:00:00 via INTRAVENOUS

## 2016-11-22 MED ORDER — LACTATED RINGERS IV BOLUS (SEPSIS)
1000.0000 mL | Freq: Once | INTRAVENOUS | Status: AC
  Administered 2016-11-22: 1000 mL via INTRAVENOUS

## 2016-11-22 MED ORDER — PROMETHAZINE HCL 12.5 MG PO TABS: 12.5000 mg | ORAL_TABLET | Freq: Four times a day (QID) | ORAL | 0 refills | Status: DC | PRN

## 2016-11-22 MED ORDER — PROMETHAZINE HCL 25 MG/ML IJ SOLN
12.5000 mg | Freq: Once | INTRAMUSCULAR | Status: AC
  Administered 2016-11-22: 12.5 mg via INTRAVENOUS
  Filled 2016-11-22: qty 1

## 2016-11-22 MED FILL — PROMETHAZINE 12.5 MG TABLET: 12.5 | 7 days supply | Qty: 30 | Fill #0

## 2016-11-22 NOTE — Discharge Instructions (Signed)
Food Choices to Help Relieve Diarrhea, Adult When you have diarrhea, the foods you eat and your eating habits are very important. Choosing the right foods and drinks can help:  Relieve diarrhea.  Replace lost fluids and nutrients.  Prevent dehydration.  What general guidelines should I follow? Relieving diarrhea  Choose foods with less than 2 g or .07 oz. of fiber per serving.  Limit fats to less than 8 tsp (38 g or 1.34 oz.) a day.  Avoid the following: ? Foods and beverages sweetened with high-fructose corn syrup, honey, or sugar alcohols such as xylitol, sorbitol, and mannitol. ? Foods that contain a lot of fat or sugar. ? Fried, greasy, or spicy foods. ? High-fiber grains, breads, and cereals. ? Raw fruits and vegetables.  Eat foods that are rich in probiotics. These foods include dairy products such as yogurt and fermented milk products. They help increase healthy bacteria in the stomach and intestines (gastrointestinal tract, or GI tract).  If you have lactose intolerance, avoid dairy products. These may make your diarrhea worse.  Take medicine to help stop diarrhea (antidiarrheal medicine) only as told by your health care provider. Replacing nutrients  Eat small meals or snacks every 3-4 hours.  Eat bland foods, such as white rice, toast, or baked potato, until your diarrhea starts to get better. Gradually reintroduce nutrient-rich foods as tolerated or as told by your health care provider. This includes: ? Well-cooked protein foods. ? Peeled, seeded, and soft-cooked fruits and vegetables. ? Low-fat dairy products.  Take vitamin and mineral supplements as told by your health care provider. Preventing dehydration   Start by sipping water or a special solution to prevent dehydration (oral rehydration solution, ORS). Urine that is clear or pale yellow means that you are getting enough fluid.  Try to drink at least 8-10 cups of fluid each day to help replace lost  fluids.  You may add other liquids in addition to water, such as clear juice or decaffeinated sports drinks, as tolerated or as told by your health care provider.  Avoid drinks with caffeine, such as coffee, tea, or soft drinks.  Avoid alcohol. What foods are recommended? The items listed may not be a complete list. Talk with your health care provider about what dietary choices are best for you. Grains White rice. White, [place], or pita breads (fresh or toasted), including plain rolls, buns, or bagels. White pasta. Saltine, soda, or graham crackers. Pretzels. Low-fiber cereal. Cooked cereals made with water (such as cornmeal, farina, or cream cereals). Plain muffins. Matzo. Melba toast. Zwieback. Vegetables Potatoes (without the skin). Most well-cooked and canned vegetables without skins or seeds. Tender lettuce. Fruits Apple sauce. Fruits canned in juice. Cooked apricots, cherries, grapefruit, peaches, pears, or plums. Fresh bananas and cantaloupe. Meats and other protein foods Baked or boiled chicken. Eggs. Tofu. Fish. Seafood. Smooth nut butters. Ground or well-cooked tender beef, ham, veal, lamb, pork, or poultry. Dairy Plain yogurt, kefir, and unsweetened liquid yogurt. Lactose-free milk, buttermilk, skim milk, or soy milk. Low-fat or nonfat hard cheese. Beverages Water. Low-calorie sports drinks. Fruit juices without pulp. Strained tomato and vegetable juices. Decaffeinated teas. Sugar-free beverages not sweetened with sugar alcohols. Oral rehydration solutions, if approved by your health care provider. Seasoning and other foods Bouillon, broth, or soups made from recommended foods. What foods are not recommended? The items listed may not be a complete list. Talk with your health care provider about what dietary choices are best for you. Grains Whole grain, whole wheat,  bran, or rye breads, rolls, pastas, and crackers. Wild or brown rice. Whole grain or bran cereals. Barley. Oats and  oatmeal. Corn tortillas or taco shells. Granola. Popcorn. Vegetables Raw vegetables. Fried vegetables. Cabbage, broccoli, Brussels sprouts, artichokes, baked beans, beet greens, corn, kale, legumes, peas, sweet potatoes, and yams. Potato skins. Cooked spinach and cabbage. Fruits Dried fruit, including raisins and dates. Raw fruits. Stewed or dried prunes. Canned fruits with syrup. Meat and other protein foods Fried or fatty meats. Deli meats. Chunky nut butters. Nuts and seeds. Beans and lentils. Berniece Salines. Hot dogs. Sausage. Dairy High-fat cheeses. Whole milk, chocolate milk, and beverages made with milk, such as milk shakes. Half-and-half. Cream. sour cream. Ice cream. Beverages Caffeinated beverages (such as coffee, tea, soda, or energy drinks). Alcoholic beverages. Fruit juices with pulp. Prune juice. Soft drinks sweetened with high-fructose corn syrup or sugar alcohols. High-calorie sports drinks. Fats and oils Butter. Cream sauces. Margarine. Salad oils. Plain salad dressings. Olives. Avocados. Mayonnaise. Sweets and desserts Sweet rolls, doughnuts, and sweet breads. Sugar-free desserts sweetened with sugar alcohols such as xylitol and sorbitol. Seasoning and other foods Honey. Hot sauce. Chili powder. Gravy. Cream-based or milk-based soups. Pancakes and waffles. Summary  When you have diarrhea, the foods you eat and your eating habits are very important.  Make sure you get at least 8-10 cups of fluid each day, or enough to keep your urine clear or pale yellow.  Eat bland foods and gradually reintroduce healthy, nutrient-rich foods as tolerated, or as told by your health care provider.  Avoid high-fiber, fried, greasy, or spicy foods. This information is not intended to replace advice given to you by your health care provider. Make sure you discuss any questions you have with your health care provider. Document Released: 05/26/2003 Document Revised: 03/02/2016 Document Reviewed:  03/02/2016 Elsevier Interactive Patient Education  2017 Elsevier Inc.   Viral Gastroenteritis, Adult Viral gastroenteritis is also known as the stomach flu. This condition is caused by certain germs (viruses). These germs can be passed from person to person very easily (are very contagious). This condition can cause sudden watery poop (diarrhea), fever, and throwing up (vomiting). Having watery poop and throwing up can make you feel weak and cause you to get dehydrated. Dehydration can make you tired and thirsty, make you have a dry mouth, and make it so you pee (urinate) less often. Older adults and people with other diseases or a weak defense system (immune system) are at higher risk for dehydration. It is important to replace the fluids that you lose from having watery poop and throwing up. Follow these instructions at home: Follow instructions from your doctor about how to care for yourself at home. Eating and drinking  Follow these instructions as told by your doctor:  Take an oral rehydration solution (ORS). This is a drink that is sold at pharmacies and stores.  Drink clear fluids in small amounts as you are able, such as: ? Water. ? Ice chips. ? Diluted fruit juice. ? Low-calorie sports drinks.  Eat bland, easy-to-digest foods in small amounts as you are able, such as: ? Bananas. ? Applesauce. ? Rice. ? Low-fat (lean) meats. ? Toast. ? Crackers.  Avoid fluids that have a lot of sugar or caffeine in them.  Avoid alcohol.  Avoid spicy or fatty foods.  General instructions  Drink enough fluid to keep your pee (urine) clear or pale yellow.  Wash your hands often. If you cannot use soap and water, use hand sanitizer.  Make sure that all people in your home wash their hands well and often.  Rest at home while you get better.  Take over-the-counter and prescription medicines only as told by your doctor.  Watch your condition for any changes.  Take a warm bath to  help with any burning or pain from having watery poop.  Keep all follow-up visits as told by your doctor. This is important. Contact a doctor if:  You cannot keep fluids down.  Your symptoms get worse.  You have new symptoms.  You feel light-headed or dizzy.  You have muscle cramps. Get help right away if:  You have chest pain.  You feel very weak or you pass out (faint).  You see blood in your throw-up.  Your throw-up looks like coffee grounds.  You have bloody or black poop (stools) or poop that look like tar.  You have a very bad headache, a stiff neck, or both.  You have a rash.  You have very bad pain, cramping, or bloating in your belly (abdomen).  You have trouble breathing.  You are breathing very quickly.  Your heart is beating very quickly.  Your skin feels cold and clammy.  You feel confused.  You have pain when you pee.  You have signs of dehydration, such as: ? Dark pee, hardly any pee, or no pee. ? Cracked lips. ? Dry mouth. ? Sunken eyes. ? Sleepiness. ? Weakness. This information is not intended to replace advice given to you by your health care provider. Make sure you discuss any questions you have with your health care provider. Document Released: 08/22/2007 Document Revised: 09/23/2015 Document Reviewed: 11/09/2014 Elsevier Interactive Patient Education  2017 [employer].   Fetal Movement Counts Patient Name: ________________________________________________ Patient Due Date: ____________________ What is a fetal movement count? A fetal movement count is the number of times that you feel your baby move during a certain amount of time. This may also be called a fetal kick count. A fetal movement count is recommended for every pregnant woman. You may be asked to start counting fetal movements as early as week 28 of your pregnancy. Pay attention to when your baby is most active. You may notice your baby's sleep and wake cycles. You may also  notice things that make your baby move more. You should do a fetal movement count:  When your baby is normally most active.  At the same time each day.  A good time to count movements is while you are resting, after having something to eat and drink. How do I count fetal movements? 1. Find a quiet, comfortable area. Sit, or lie down on your side. 2. Write down the date, the start time and stop time, and the number of movements that you felt between those two times. Take this information with you to your health care visits. 3. For 2 hours, count kicks, flutters, swishes, rolls, and jabs. You should feel at least 10 movements during 2 hours. 4. You may stop counting after you have felt 10 movements. 5. If you do not feel 10 movements in 2 hours, have something to eat and drink. Then, keep resting and counting for 1 hour. If you feel at least 4 movements during that hour, you may stop counting. Contact a health care provider if:  You feel fewer than 4 movements in 2 hours.  Your baby is not moving like he or she usually does. Date: ____________ Start time: ____________ Stop time: ____________ Movements: ____________ Date:  ____________ Start time: ____________ Stop time: ____________ Movements: ____________ Date: ____________ Start time: ____________ Stop time: ____________ Movements: ____________ Date: ____________ Start time: ____________ Stop time: ____________ Movements: ____________ Date: ____________ Start time: ____________ Stop time: ____________ Movements: ____________ Date: ____________ Start time: ____________ Stop time: ____________ Movements: ____________ Date: ____________ Start time: ____________ Stop time: ____________ Movements: ____________ Date: ____________ Start time: ____________ Stop time: ____________ Movements: ____________ Date: ____________ Start time: ____________ Stop time: ____________ Movements: ____________ This information is not intended to replace advice  given to you by your health care provider. Make sure you discuss any questions you have with your health care provider. Document Released: 04/04/2006 Document Revised: 11/02/2015 Document Reviewed: 04/14/2015 Elsevier Interactive Patient Education  2018 [employer].  [employer] of the uterus can occur throughout pregnancy, but they are not always a sign that you are in labor. You may have practice contractions called Braxton Hicks contractions. These false labor contractions are sometimes confused with true labor. What are [place] contractions? Braxton Hicks contractions are tightening movements that occur in the muscles of the uterus before labor. Unlike true labor contractions, these contractions do not result in opening (dilation) and thinning of the cervix. Toward the end of pregnancy (32-34 weeks), Braxton Hicks contractions can happen more often and may become stronger. These contractions are sometimes difficult to tell apart from true labor because they can be very uncomfortable. You should not feel embarrassed if you go to the hospital with false labor. Sometimes, the only way to tell if you are in true labor is for your health care provider to look for changes in the cervix. The health care provider will do a physical exam and may monitor your contractions. If you are not in true labor, the exam should show that your cervix is not dilating and your water has not broken. If there are no prenatal problems or other health problems associated with your pregnancy, it is completely safe for you to be sent home with false labor. You may continue to have Braxton Hicks contractions until you go into true labor. How can I tell the difference between true labor and false labor?  Differences ? False labor ? Contractions last 30-70 seconds.: Contractions are usually shorter and not as strong as true labor contractions. ? Contractions become very regular.: Contractions  are usually irregular. ? Discomfort is usually felt in the top of the uterus, and it spreads to the lower abdomen and low back.: Contractions are often felt in the front of the lower abdomen and in the groin. ? Contractions do not go away with walking.: Contractions may go away when you walk around or change positions while lying down. ? Contractions usually become more intense and increase in frequency.: Contractions get weaker and are shorter-lasting as time goes on. ? The cervix dilates and gets thinner.: The cervix usually does not dilate or become thin. Follow these instructions at home:  Take over-the-counter and prescription medicines only as told by your health care provider.  Keep up with your usual exercises and follow other instructions from your health care provider.  Eat and drink lightly if you think you are going into labor.  If Braxton Hicks contractions are making you uncomfortable: ? Change your position from lying down or resting to walking, or change from walking to resting. ? Sit and rest in a tub of warm water. ? Drink enough fluid to keep your urine clear or pale yellow. Dehydration may cause these contractions. ? Do  slow and deep breathing several times an hour.  Keep all follow-up prenatal visits as told by your health care provider. This is important. Contact a health care provider if:  You have a fever.  You have continuous pain in your abdomen. Get help right away if:  Your contractions become stronger, more regular, and closer together.  You have fluid leaking or gushing from your vagina.  You pass blood-tinged mucus (bloody show).  You have bleeding from your vagina.  You have low back pain that you never had before.  You feel your babys head pushing down and causing pelvic pressure.  Your baby is not moving inside you as much as it used to. Summary  Contractions that occur before labor are called Braxton Hicks contractions, false labor, or  practice contractions.  Braxton Hicks contractions are usually shorter, weaker, farther apart, and less regular than true labor contractions. True labor contractions usually become progressively stronger and regular and they become more frequent.  Manage discomfort from [facility] contractions by changing position, resting in a warm bath, drinking plenty of water, or practicing deep breathing. This information is not intended to replace advice given to you by your health care provider. Make sure you discuss any questions you have with your health care provider. Document Released: 03/05/2005 Document Revised: 01/23/2016 Document Reviewed: 01/23/2016 Elsevier Interactive Patient Education  2017 [employer].

## 2016-11-22 NOTE — MAU Note (Signed)
Pt here with c/o vomiting and diarrhea since about 1930 last night. Think "I have a stomach bug."

## 2016-11-22 NOTE — MAU Provider Note (Signed)
Chief Complaint:  Diarrhea and Emesis   First Provider Initiated Contact with Patient 11/22/16 0501     HPI: Paula Wilkins is a 36 y.o. G3P1010 at 32w6dwho presents to maternity admissions reporting vomiting and diarrhea.  Her son had the stomach bug this week and has since recovered.  She has had 2 episodes of "vomiting 3 times".  7 episodes of diarrhea.  Does have fever.. She reports good fetal movement, denies LOF, vaginal bleeding, vaginal itching/burning, urinary symptoms, h/a, dizziness, constipation or fever/chills.    Diarrhea   This is a new problem. The current episode started today. The problem occurs 5 to 10 times per day. The problem has been unchanged. Associated symptoms include a fever and vomiting. Pertinent negatives include no abdominal pain, chills, headaches or myalgias. Nothing aggravates the symptoms. Risk factors include ill contacts. She has tried nothing for the symptoms.  Emesis   This is a new problem. The current episode started today. The problem occurs 5 to 10 times per day. The problem has been gradually improving. The maximum temperature recorded prior to her arrival was 100.4 - 100.9 F. The fever has been present for less than 1 day. Associated symptoms include diarrhea and a fever. Pertinent negatives include no abdominal pain, chills, dizziness, headaches or myalgias. Risk factors include ill contacts. She has tried nothing for the symptoms.    RN Note: Pt here with c/o vomiting and diarrhea since about 1930 last night. Think "I have a stomach bug."  Past Medical History: No past medical history on file.  Past obstetric history: OB History  Gravida Para Term Preterm AB Living  3 1 1   1     SAB TAB Ectopic Multiple Live Births  1            # Outcome Date GA Lbr Len/2nd Weight Sex Delivery Anes PTL Lv  3 Current           2 SAB 01/2016          1 Term 2009     Vag-Spont         Past Surgical History: No past surgical history on file.  Family  History: No family history on file.  Social History: Social History  Substance Use Topics  . Smoking status: Never Smoker  . Smokeless tobacco: Not on file  . Alcohol use Yes    Allergies: No Known Allergies  Meds:  Prescriptions Prior to Admission  Medication Sig Dispense Refill Last Dose  . ferrous sulfate 325 (65 FE) MG tablet Take 325 mg by mouth daily with breakfast.     . Prenatal Vit-Fe Fumarate-FA (PRENATAL MULTIVITAMIN) TABS tablet Take 1 tablet by mouth daily at 12 noon.     Marland Kitchen doxycycline (VIBRA-TABS) 100 MG tablet Take 1 tablet (100 mg total) by mouth 2 (two) times daily. 20 tablet 0     I have reviewed patient's Past Medical Hx, Surgical Hx, Family Hx, Social Hx, medications and allergies.   ROS:  Review of Systems  Constitutional: Positive for fever. Negative for chills.  Gastrointestinal: Positive for diarrhea and vomiting. Negative for abdominal pain.  Musculoskeletal: Negative for myalgias.  Neurological: Negative for dizziness and headaches.   Other systems negative  Physical Exam  Patient Vitals for the past 24 hrs:  BP Temp Temp src Pulse Resp SpO2  11/22/16 0457 105/62 99.2 F (37.3 C) Oral (!) 121 19 99 %   Constitutional: Well-developed, well-nourished female in no acute distress.  Cardiovascular: Tachycardic,  normal rhythm Respiratory: normal effort, clear to auscultation bilaterally GI: Abd soft, non-tender, gravid appropriate for gestational age.   No rebound or guarding. MS: Extremities nontender, no edema, normal ROM Neurologic: Alert and oriented x 4.  GU: Neg CVAT.  PELVIC EXAM: Dilation: 1 Effacement (%): 50 Cervical Position: Posterior Station: -2 Presentation: Vertex Exam by:: williams cnm     FHT:  Baseline 150-160 , moderate variability, accelerations present, no decelerations Contractions: q 3 mins Irregular    Labs: Results for orders placed or performed during the hospital encounter of 11/22/16 (from the past 24 hour(s))   Urinalysis, Routine w reflex microscopic     Status: Abnormal   Collection Time: 11/22/16  4:45 AM  Result Value Ref Range   Color, Urine YELLOW YELLOW   APPearance HAZY (A) CLEAR   Specific Gravity, Urine 1.023 1.005 - 1.030   pH 5.0 5.0 - 8.0   Glucose, UA NEGATIVE NEGATIVE mg/dL   Hgb urine dipstick NEGATIVE NEGATIVE   Bilirubin Urine NEGATIVE NEGATIVE   Ketones, ur 80 (A) NEGATIVE mg/dL   Protein, ur 30 (A) NEGATIVE mg/dL   Nitrite NEGATIVE NEGATIVE   Leukocytes, UA TRACE (A) NEGATIVE   RBC / HPF 0-5 0 - 5 RBC/hpf   WBC, UA 6-30 0 - 5 WBC/hpf   Bacteria, UA FEW (A) NONE SEEN   Squamous Epithelial / LPF 0-5 (A) NONE SEEN   Mucus PRESENT       Imaging:  No results found.  MAU Course/MDM: I have ordered labs and reviewed results. Urine showed some dehydration NST reviewed Consult Dr Terri Piedra with presentation, exam findings and test results.  Treatments in MAU included IV hydration to see if rehydration will bring down FHR baseline (which may be elevated due to fever or dehydration).  When rehydrated and FHR at a more normal baseline, will discharge home  Assessment: Single IUP at [redacted]w[redacted]d Probable viral gastroenteritis Dehydration, mild Fetal tachycardia with a reactive pattern.  Tachy likely due to fever  Plan: Will continue hydration Report given to oncoming provider   Hansel Feinstein CNM, MSN Certified Nurse-Midwife 11/22/2016 5:01 AM  MDM 0800 - Care assumed from Hansel Feinstein, CNM Patient able to tolerate PO while in MAU Discussed patient with Dr. Melba Coon. Minimal cervical change noted on re-check. Recommends patient call the office to change appointment to this afternoon for follow-up  A: SIUP at [redacted]w[redacted]d Viral gastroenteritis   P:  Discharge home Rx for Phenergan given to patient  Labor precautions discussed Diet for gastroenteritis included on AVS Patient advised to follow-up with [facility] this afternoon Patient may return to MAU as needed or if  her condition were to change or worsen  Luvenia Redden, PA-C 11/22/2016 9:17 AM

## 2016-11-30 ENCOUNTER — Telehealth (HOSPITAL_COMMUNITY): Payer: Self-pay | Admitting: *Deleted

## 2016-11-30 NOTE — Telephone Encounter (Signed)
Preadmission screen  

## 2016-12-04 ENCOUNTER — Telehealth (HOSPITAL_COMMUNITY): Payer: Self-pay | Admitting: *Deleted

## 2016-12-04 ENCOUNTER — Encounter (HOSPITAL_COMMUNITY): Payer: Self-pay | Admitting: *Deleted

## 2016-12-04 NOTE — Telephone Encounter (Signed)
Preadmission screen  

## 2016-12-07 ENCOUNTER — Encounter (HOSPITAL_COMMUNITY): Payer: Self-pay | Admitting: *Deleted

## 2016-12-07 ENCOUNTER — Inpatient Hospital Stay (HOSPITAL_COMMUNITY)
Admission: AD | Admit: 2016-12-07 | Discharge: 2016-12-10 | DRG: 775 | Disposition: A | Discharge status: Home / Self Care | Payer: 59 | Source: Ambulatory Visit | Attending: Obstetrics and Gynecology | Admitting: Obstetrics and Gynecology

## 2016-12-07 DIAGNOSIS — O26893 Other specified pregnancy related conditions, third trimester: Secondary | ICD-10-CM | POA: Diagnosis present

## 2016-12-07 DIAGNOSIS — Z3A40 40 weeks gestation of pregnancy: Secondary | ICD-10-CM | POA: Diagnosis not present

## 2016-12-07 DIAGNOSIS — O368130 Decreased fetal movements, third trimester, not applicable or unspecified: Secondary | ICD-10-CM | POA: Diagnosis not present

## 2016-12-07 LAB — CBC
HCT: 38.2 % (ref 36.0–46.0)
HEMOGLOBIN: 12.4 g/dL (ref 12.0–15.0)
MCH: 28.1 pg (ref 26.0–34.0)
MCHC: 32.5 g/dL (ref 30.0–36.0)
MCV: 86.6 fL (ref 78.0–100.0)
Platelets: 181 10*3/uL (ref 150–400)
RBC: 4.41 MIL/uL (ref 3.87–5.11)
RDW: 15.8 % — AB (ref 11.5–15.5)
WBC: 10.4 10*3/uL (ref 4.0–10.5)

## 2016-12-07 MED ORDER — FLEET ENEMA 7-19 GM/118ML RE ENEM: 1.0000 | ENEMA | RECTAL | Status: DC | PRN

## 2016-12-07 MED ORDER — EPHEDRINE 5 MG/ML INJ
10.0000 mg | INTRAVENOUS | Status: DC | PRN
  Filled 2016-12-07: qty 2

## 2016-12-07 MED ORDER — OXYCODONE-ACETAMINOPHEN 5-325 MG PO TABS: 1.0000 | ORAL_TABLET | ORAL | Status: DC | PRN

## 2016-12-07 MED ORDER — LACTATED RINGERS IV SOLN: INTRAVENOUS | Status: DC

## 2016-12-07 MED ORDER — LACTATED RINGERS IV SOLN: 500.0000 mL | INTRAVENOUS | Status: DC | PRN

## 2016-12-07 MED ORDER — LACTATED RINGERS IV SOLN
500.0000 mL | Freq: Once | INTRAVENOUS | Status: AC
  Administered 2016-12-07: via INTRAVENOUS

## 2016-12-07 MED ORDER — PHENYLEPHRINE 40 MCG/ML (10ML) SYRINGE FOR IV PUSH (FOR BLOOD PRESSURE SUPPORT)
PREFILLED_SYRINGE | INTRAVENOUS | Status: AC
  Filled 2016-12-07: qty 10

## 2016-12-07 MED ORDER — OXYTOCIN BOLUS FROM INFUSION
500.0000 mL | Freq: Once | INTRAVENOUS | Status: AC
  Administered 2016-12-08: 999 mL/h via INTRAVENOUS

## 2016-12-07 MED ORDER — FENTANYL 2.5 MCG/ML BUPIVACAINE 1/10 % EPIDURAL INFUSION (WH - ANES)
INTRAMUSCULAR | Status: AC
  Filled 2016-12-07: qty 100

## 2016-12-07 MED ORDER — ONDANSETRON HCL 4 MG/2ML IJ SOLN: 4.0000 mg | Freq: Four times a day (QID) | INTRAMUSCULAR | Status: DC | PRN

## 2016-12-07 MED ORDER — FENTANYL 2.5 MCG/ML BUPIVACAINE 1/10 % EPIDURAL INFUSION (WH - ANES)
14.0000 mL/h | INTRAMUSCULAR | Status: DC | PRN
  Administered 2016-12-08: 10 mL/h via EPIDURAL

## 2016-12-07 MED ORDER — LIDOCAINE HCL (PF) 1 % IJ SOLN
30.0000 mL | INTRAMUSCULAR | Status: DC | PRN
  Filled 2016-12-07: qty 30

## 2016-12-07 MED ORDER — PHENYLEPHRINE 40 MCG/ML (10ML) SYRINGE FOR IV PUSH (FOR BLOOD PRESSURE SUPPORT)
80.0000 ug | PREFILLED_SYRINGE | INTRAVENOUS | Status: DC | PRN
  Filled 2016-12-07: qty 5

## 2016-12-07 MED ORDER — OXYCODONE-ACETAMINOPHEN 5-325 MG PO TABS: 2.0000 | ORAL_TABLET | ORAL | Status: DC | PRN

## 2016-12-07 MED ORDER — ACETAMINOPHEN 325 MG PO TABS: 650.0000 mg | ORAL_TABLET | ORAL | Status: DC | PRN

## 2016-12-07 MED ORDER — OXYTOCIN 40 UNITS IN LACTATED RINGERS INFUSION - SIMPLE MED
2.5000 [IU]/h | INTRAVENOUS | Status: DC
  Filled 2016-12-07: qty 1000

## 2016-12-07 MED ORDER — SOD CITRATE-CITRIC ACID 500-334 MG/5ML PO SOLN: 30.0000 mL | ORAL | Status: DC | PRN

## 2016-12-07 MED ORDER — DIPHENHYDRAMINE HCL 50 MG/ML IJ SOLN: 12.5000 mg | INTRAMUSCULAR | Status: DC | PRN

## 2016-12-07 NOTE — MAU Note (Signed)
Was seen this am and was 4cm. R/O SROM this am at office. Contractions stronger since 2030 tonight with bloody show.

## 2016-12-07 NOTE — Anesthesia Preprocedure Evaluation (Signed)
Anesthesia Evaluation  Patient identified by MRN, date of birth, ID band Patient awake    History of Anesthesia Complications Negative for: history of anesthetic complications  Airway Mallampati: II  TM Distance: >3 FB Neck ROM: Full    Dental no notable dental hx.    Pulmonary neg pulmonary ROS,    Pulmonary exam normal        Cardiovascular negative cardio ROS Normal cardiovascular exam Rhythm:Regular     Neuro/Psych negative neurological ROS  negative psych ROS   GI/Hepatic negative GI ROS, Neg liver ROS,   Endo/Other  negative endocrine ROS  Renal/GU negative Renal ROS     Musculoskeletal negative musculoskeletal ROS (+)   Abdominal Normal abdominal exam  (+)   Peds negative pediatric ROS (+)  Hematology negative hematology ROS (+)   Anesthesia Other Findings   Reproductive/Obstetrics (+) Pregnancy                             Anesthesia Physical Anesthesia Plan  ASA: II  Anesthesia Plan: Epidural   Post-op Pain Management:    Induction:   PONV Risk Score and Plan: 0  Airway Management Planned:   Additional Equipment:   Intra-op Plan:   Post-operative Plan:   Informed Consent: I have reviewed the patients History and Physical, chart, labs and discussed the procedure including the risks, benefits and alternatives for the proposed anesthesia with the patient or authorized representative who has indicated his/her understanding and acceptance.     Plan Discussed with:   Anesthesia Plan Comments:         Anesthesia Quick Evaluation

## 2016-12-08 ENCOUNTER — Inpatient Hospital Stay (HOSPITAL_COMMUNITY): Payer: 59 | Admitting: Anesthesiology

## 2016-12-08 ENCOUNTER — Encounter (HOSPITAL_COMMUNITY): Payer: Self-pay | Admitting: Obstetrics and Gynecology

## 2016-12-08 LAB — CBC
HCT: 36.2 % (ref 36.0–46.0)
Hemoglobin: 11.8 g/dL — ABNORMAL LOW (ref 12.0–15.0)
MCH: 28.4 pg (ref 26.0–34.0)
MCHC: 32.6 g/dL (ref 30.0–36.0)
MCV: 87 fL (ref 78.0–100.0)
Platelets: 168 10*3/uL (ref 150–400)
RBC: 4.16 MIL/uL (ref 3.87–5.11)
RDW: 16 % — ABNORMAL HIGH (ref 11.5–15.5)
WBC: 15.2 10*3/uL — ABNORMAL HIGH (ref 4.0–10.5)

## 2016-12-08 LAB — RPR: RPR: NONREACTIVE

## 2016-12-08 LAB — TYPE AND SCREEN
ABO/RH(D): O POS
Antibody Screen: NEGATIVE

## 2016-12-08 LAB — ABO/RH: ABO/RH(D): O POS

## 2016-12-08 MED ORDER — OXYCODONE HCL 5 MG PO TABS: 5.0000 mg | ORAL_TABLET | ORAL | Status: DC | PRN

## 2016-12-08 MED ORDER — BENZOCAINE-MENTHOL 20-0.5 % EX AERO
1.0000 "application " | INHALATION_SPRAY | CUTANEOUS | Status: DC | PRN
  Administered 2016-12-08: 1 via TOPICAL
  Filled 2016-12-08: qty 56

## 2016-12-08 MED ORDER — TETANUS-DIPHTH-ACELL PERTUSSIS 5-2.5-18.5 LF-MCG/0.5 IM SUSP: 0.5000 mL | Freq: Once | INTRAMUSCULAR | Status: DC

## 2016-12-08 MED ORDER — SIMETHICONE 80 MG PO CHEW
80.0000 mg | CHEWABLE_TABLET | ORAL | Status: DC | PRN
Start: 2016-12-08 — End: 2016-12-10

## 2016-12-08 MED ORDER — SENNOSIDES-DOCUSATE SODIUM 8.6-50 MG PO TABS
2.0000 | ORAL_TABLET | ORAL | Status: DC
  Administered 2016-12-08 – 2016-12-09 (×2): 2 via ORAL
  Filled 2016-12-08 (×2): qty 2

## 2016-12-08 MED ORDER — OXYCODONE HCL 5 MG PO TABS: 10.0000 mg | ORAL_TABLET | ORAL | Status: DC | PRN

## 2016-12-08 MED ORDER — ACETAMINOPHEN 325 MG PO TABS: 650.0000 mg | ORAL_TABLET | ORAL | Status: DC | PRN

## 2016-12-08 MED ORDER — PRENATAL MULTIVITAMIN CH
1.0000 | ORAL_TABLET | Freq: Every day | ORAL | Status: DC
  Administered 2016-12-08 – 2016-12-09 (×2): 1 via ORAL
  Filled 2016-12-08 (×2): qty 1

## 2016-12-08 MED ORDER — WITCH HAZEL-GLYCERIN EX PADS: 1.0000 "application " | MEDICATED_PAD | CUTANEOUS | Status: DC | PRN

## 2016-12-08 MED ORDER — ONDANSETRON HCL 4 MG/2ML IJ SOLN
4.0000 mg | INTRAMUSCULAR | Status: DC | PRN
Start: 2016-12-08 — End: 2016-12-10

## 2016-12-08 MED ORDER — DIPHENHYDRAMINE HCL 25 MG PO CAPS
25.0000 mg | ORAL_CAPSULE | Freq: Four times a day (QID) | ORAL | Status: DC | PRN
Start: 2016-12-08 — End: 2016-12-10

## 2016-12-08 MED ORDER — IBUPROFEN 600 MG PO TABS
600.0000 mg | ORAL_TABLET | Freq: Four times a day (QID) | ORAL | Status: DC
  Administered 2016-12-08 – 2016-12-10 (×9): 600 mg via ORAL
  Filled 2016-12-08 (×9): qty 1

## 2016-12-08 MED ORDER — DIBUCAINE 1 % RE OINT: 1.0000 "application " | TOPICAL_OINTMENT | RECTAL | Status: DC | PRN

## 2016-12-08 MED ORDER — ONDANSETRON HCL 4 MG PO TABS: 4.0000 mg | ORAL_TABLET | ORAL | Status: DC | PRN

## 2016-12-08 MED ORDER — ZOLPIDEM TARTRATE 5 MG PO TABS: 5.0000 mg | ORAL_TABLET | Freq: Every evening | ORAL | Status: DC | PRN

## 2016-12-08 MED ORDER — COCONUT OIL OIL: 1.0000 "application " | TOPICAL_OIL | Status: DC | PRN

## 2016-12-08 NOTE — Progress Notes (Signed)
Patient ID: Paula Wilkins, female   DOB: March 15, 1981, 36 y.o.   MRN: [id number] Pt doing well. Rested; pain well controlled, lochia mild. Denies CP/SOB or HA. Bonding well with baby; breastfeeding. No complaints VSS FF below umb EXT - no homans or edema  15.2>11.8<168  A/P: PPD#0 s/p svd - stable         Routine pp care         Circ for baby tomorrow

## 2016-12-08 NOTE — Anesthesia Postprocedure Evaluation (Signed)
Anesthesia Post Note  Patient: Paula Wilkins  Procedure(s) Performed: * No procedures listed *     Patient location during evaluation: Mother Baby Anesthesia Type: Epidural Level of consciousness: awake and alert Pain management: satisfactory to patient Vital Signs Assessment: post-procedure vital signs reviewed and stable Respiratory status: spontaneous breathing Cardiovascular status: blood pressure returned to baseline Postop Assessment: no headache, epidural receding, patient able to bend at knees and no apparent nausea or vomiting Anesthetic complications: no Comments: Pain score 1.      Last Vitals:  Vitals:   12/08/16 0600 12/08/16 1115  BP: 132/81 137/89  Pulse: 85 80  Resp: 18   Temp: 36.7 C 36.9 C  SpO2: 98%     Last Pain:  Vitals:   12/08/16 1132  TempSrc:   PainSc: 2    Pain Goal: Patients Stated Pain Goal: 0 (12/07/16 2248)               Rico Sheehan

## 2016-12-08 NOTE — Anesthesia Procedure Notes (Signed)
Epidural Patient location during procedure: OB Start time: 12/08/2016 12:10 AM  Staffing Anesthesiologist: Clydell Hakim Performed: anesthesiologist   Preanesthetic Checklist Completed: patient identified, site marked, surgical consent, pre-op evaluation, timeout performed, IV checked, risks and benefits discussed and monitors and equipment checked  Epidural Patient position: sitting Prep: DuraPrep Patient monitoring: heart rate, continuous pulse ox and blood pressure Location: L3-L4 Injection technique: LOR saline  Needle:  Needle type: Tuohy  Needle gauge: 17 G Needle length: 9 cm and 9 Needle insertion depth: 6 cm Catheter type: closed end flexible Catheter size: 20 Guage Catheter at skin depth: 11 cm Test dose: negative and 2% lidocaine with Epi 1:200 K  Assessment Events: blood not aspirated, injection not painful, no injection resistance, negative IV test and no paresthesia  Additional Notes Patient identified. Risks/Benefits/Options discussed with patient including but not limited to bleeding, infection, nerve damage, paralysis, failed block, incomplete pain control, headache, blood pressure changes, nausea, vomiting, reactions to medication both or allergic, itching and postpartum back pain. Confirmed with bedside nurse the patient's most recent platelet count. Confirmed with patient that they are not currently taking any anticoagulation, have any bleeding history or any family history of bleeding disorders. Patient expressed understanding and wished to proceed. All questions were answered. Sterile technique was used throughout the entire procedure. Please see nursing notes for vital signs. Test dose was given through epidural needle and negative prior to continuing to dose epidural or start infusion.  All questions and concerns addressed with instructions to call with any issues.

## 2016-12-08 NOTE — H&P (Signed)
Paula Wilkins is a 36 y.o. female presenting for painful contractions that begun a few hours ago. Cervical change noted on arrival at [facility]; intact.  Pts EDD is per LMP and confirmed with an 8wk [place]. Panorama with low risk results. Prenatal care relatively uncomplicated- benign lump in right breast noted. . OB History    Gravida Para Term Preterm AB Living   3 1 1   1 1    SAB TAB Ectopic Multiple Live Births   1       1     Past Medical History:  Diagnosis Date  . Medical history non-contributory    Past Surgical History:  Procedure Laterality Date  . NO PAST SURGERIES     Family History: family history includes Arthritis in her maternal grandfather, maternal grandmother, paternal grandfather, and paternal grandmother; Birth defects in her son; COPD in her maternal grandfather; Hyperlipidemia in her maternal grandmother and mother. Social History:  reports that she has never smoked. She has never used smokeless tobacco. She reports that she drinks alcohol. Her drug history is not on file.     Maternal Diabetes: No Genetic Screening: Normal Maternal Ultrasounds/Referrals: Normal Fetal Ultrasounds or other Referrals:  None Maternal Substance Abuse:  No Significant Maternal Medications:  None Significant Maternal Lab Results:  Lab values include: Group B Strep negative Other Comments:  None  Review of Systems  Constitutional: Positive for malaise/fatigue. Negative for chills, fever and weight loss.  Eyes: Negative for blurred vision and double vision.  Respiratory: Negative for shortness of breath.   Cardiovascular: Negative for chest pain.  Gastrointestinal: Positive for abdominal pain. Negative for heartburn, nausea and vomiting.  Genitourinary: Negative for dysuria.  Musculoskeletal: Negative for myalgias.  Skin: Negative for itching and rash.  Neurological: Negative for dizziness and headaches.  Endo/Heme/Allergies: Does not bruise/bleed easily.  Psychiatric/Behavioral: Negative  for depression, hallucinations, substance abuse and suicidal ideas. The patient is not nervous/anxious.    Maternal Medical History:  Reason for admission: Contractions.  Nausea.  Contractions: Onset was 13-24 hours ago.   Frequency: regular.   Perceived severity is moderate.    Fetal activity: Perceived fetal activity is normal.   Last perceived fetal movement was within the past hour.    Prenatal complications: no prenatal complications Prenatal Complications - Diabetes: none.    Dilation: 8 Effacement (%): 100 Station: 0 Exam by:: Veronica Mensah Blood pressure (!) 137/95, pulse 98, temperature 98.5 F (36.9 C), temperature source Oral, resp. rate 18, height 5\' 6"  (1.676 m), weight 185 lb (83.9 kg), SpO2 100 %. Maternal Exam:  Uterine Assessment: Contraction strength is moderate.  Contraction frequency is irregular.   Abdomen: Patient reports generalized tenderness.  Estimated fetal weight is AGA.   Fetal presentation: vertex  Introitus: Normal vulva. Vulva is negative for condylomata and lesion.  Normal vagina.  Vagina is negative for condylomata.  Pelvis: adequate for delivery.   Cervix: Cervix evaluated by digital exam.     Physical Exam  Constitutional: She is oriented to person, place, and time. She appears well-developed and well-nourished.  Neck: Normal range of motion.  Cardiovascular: Normal rate.   Respiratory: Effort normal.  GI: Soft. There is generalized tenderness.  Genitourinary: Vagina normal and uterus normal. Vulva exhibits no lesion.  Musculoskeletal: Normal range of motion. She exhibits no edema.  Neurological: She is alert and oriented to person, place, and time.  Skin: Skin is warm.  Psychiatric: She has a normal mood and affect. Her behavior is normal. Judgment  and thought content normal.    Prenatal labs: ABO, Rh: --/--/O POS (09/21 2310) Antibody: NEG (09/21 2310) Rubella: Immune (02/23 0000) RPR: Nonreactive (02/23 0000)  HBsAg:  Negative (02/23 0000)  HIV: Non-reactive (02/23 0000)  GBS: Negative (08/23 0000)   Assessment/Plan: [id number] at 81 1/[redacted]wks gestation in active labor Now comfortable with epidural GBS neg AROM performed with moderate return of clear fluid Augment if needed Anticipate svd    Paula Wilkins 12/08/2016, 1:32 AM

## 2016-12-08 NOTE — Lactation Note (Signed)
This note was copied from a baby's chart. Lactation Consultation Note  Patient Name: Paula Wilkins [id number] Date: 12/08/2016 Reason for consult: Initial assessment Infant is 66 hours old & seen by [facility] for Initial assessment. Baby was born at [redacted]w[redacted]d and weighed 8 lbs 11.7 oz at birth. Baby was asleep with mom when [place] entered. Mom reports BF is going well and reports no pain or discomfort. Mom reports she BF her first child for ~53yr and plans to do the same with this baby. 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  Provided mom with BF Booklet, BF Resources, and feeding log; mom made aware of O/P services, breastfeeding support groups, community resources, and our phone # for post-discharge questions.  Mom encouraged to feed baby 8-12 times/24 hours and with feeding cues. Encouraged skin to skin and hand expression. Mom reports no questions at this time. Encouraged mom to ask for help as needed.  Maternal Data    Feeding  LATCH Score                   Interventions Interventions: Breast feeding basics reviewed  Lactation Tools Discussed/Used     Consult Status Consult Status: Follow-up Date: 12/09/16 Follow-up type: In-patient    Yvonna Alanis 12/08/2016, 4:12 PM

## 2016-12-09 NOTE — Progress Notes (Signed)
Patient ID: Paula Wilkins, female   DOB: Jul 25, 1980, 36 y.o.   MRN: [id number] Pt doing well with no complaints. Pain well controlled, lochia mild. No CP/SOB or HA. Breastfeeding well and bonding with baby. Baby with hypospadias so will defer circ ( also with elev bilirubin) VSS ABD - soft, FF to right but below umb EXT - no edema or homans  A/P: [id number] on PPD#1 s/p svd - stable         Routine pp care         Likely discharge to home tomorrrow

## 2016-12-09 NOTE — Lactation Note (Signed)
This note was copied from a baby's chart. Lactation Consultation Note  Patient Name: Paula Wilkins [id number] Date: 12/09/2016 Reason for consult: Follow-up assessment Baby at 37 hr of life. Upon entry baby was sleeping in a visitor's arms with a pacifier. Mom reports baby is latching well, dad stated he latched every hr for 30 minutes today. Mom denies breast or nipple pain. Mom has been using a Medela PIS that she got at a consignment sell to help bring her milk in more quickly. She reports taking 5 days for her milk to transition with her 1st child. Discussed baby behavior, feeding frequency, pumping, artificial nipples, the dangers of used bf products, baby belly size, voids, wt loss, breast changes, and nipple care. Parents are aware of lactation services and support group. Given [employer] Employee pump today.    Maternal Data Has patient been taught Hand Expression?: Yes Does the patient have breastfeeding experience prior to this delivery?: Yes  Feeding Feeding Type: Breast Fed Length of feed: 22 min  LATCH Score Latch: Grasps breast easily, tongue down, lips flanged, rhythmical sucking.  Audible Swallowing: A few with stimulation  Type of Nipple: Everted at rest and after stimulation  Comfort (Breast/Nipple): Soft / non-tender  Hold (Positioning): Assistance needed to correctly position infant at breast and maintain latch.  LATCH Score: 8  Interventions    Lactation Tools Discussed/Used     Consult Status Consult Status: Follow-up Date: 12/10/16 Follow-up type: In-patient    Denzil Hughes 12/09/2016, 4:48 PM

## 2016-12-10 ENCOUNTER — Encounter (HOSPITAL_COMMUNITY): Payer: 59

## 2016-12-10 MED ORDER — IBUPROFEN 800 MG PO TABS: 800.0000 mg | ORAL_TABLET | Freq: Three times a day (TID) | ORAL | 1 refills | Status: DC | PRN

## 2016-12-10 MED ORDER — PRENATAL MULTIVITAMIN CH
1.0000 | ORAL_TABLET | Freq: Every day | ORAL | 3 refills | Status: DC
Start: 2016-12-10 — End: 2021-01-29

## 2016-12-10 NOTE — Discharge Summary (Signed)
OB Discharge Summary     Patient Name: Paula Wilkins DOB: Nov 16, 1980 MRN: [id number]  Date of admission: 12/07/2016 Delivering MD: Carlynn Purl [facility]   Date of discharge: 12/10/2016  Admitting diagnosis: 40WKS CTX 3 TO 4 MINS 4cm Dilation Intrauterine pregnancy: [redacted]w[redacted]d     Secondary diagnosis:  Active Problems:   Normal labor   SVD (spontaneous vaginal delivery)   Postpartum care following vaginal delivery  Additional problems: N/A     Discharge diagnosis: Term Pregnancy Delivered                                                                                                Post partum procedures:N/A  Augmentation: AROM  Complications: None  Hospital course:  Onset of Labor With Vaginal Delivery     36 y.o. yo G3P2011 at [redacted]w[redacted]d was admitted in Active Labor on 12/07/2016. Patient had an uncomplicated labor course as follows:  Membrane Rupture Time/Date: 1:25 AM ,12/08/2016   Intrapartum Procedures: Episiotomy: None [1]                                         Lacerations:  1st degree [2];Labial [10];Perineal [11]  Patient had a delivery of a Viable infant. 12/08/2016  Information for the patient's newborn:  Micheline, Markes [[id number]]       Pateint had an uncomplicated postpartum course.  She is ambulating, tolerating a regular diet, passing flatus, and urinating well. Patient is discharged home in stable condition on 12/10/16.   Physical exam  Vitals:   12/08/16 1840 12/09/16 0523 12/09/16 1719 12/10/16 0639  BP: 130/80 131/84 134/85 124/86  Pulse: 91 100 (!) 101 100  Resp:  18 18 18   Temp: 98.2 F (36.8 C) 98.1 F (36.7 C) 98.8 F (37.1 C) 99.1 F (37.3 C)  TempSrc:  Oral Oral Oral  SpO2:      Weight:      Height:       General: alert and no distress Lochia: appropriate Uterine Fundus: firm  Labs: Lab Results  Component Value Date   WBC 15.2 (H) 12/08/2016   HGB 11.8 (L) 12/08/2016   HCT 36.2 12/08/2016   MCV 87.0 12/08/2016   PLT 168 12/08/2016   No  flowsheet data found.  Discharge instruction: per After Visit Summary and "Baby and Me Booklet".  After visit meds:  Allergies as of 12/10/2016   No Known Allergies     Medication List    STOP taking these medications   ferrous sulfate 325 (65 FE) MG tablet     TAKE these medications   calcium carbonate 500 MG chewable tablet Commonly known as:  TUMS - dosed in mg elemental calcium Chew 1,000 mg by mouth at bedtime as needed for indigestion or heartburn.   ibuprofen 800 MG tablet Commonly known as:  ADVIL,MOTRIN Take 1 tablet (800 mg total) by mouth every 8 (eight) hours as needed.   prenatal multivitamin Tabs tablet Take 1 tablet by mouth daily at 12 noon.  promethazine 12.5 MG tablet Commonly known as:  PHENERGAN Take 1 tablet (12.5 mg total) by mouth every 6 (six) hours as needed for nausea or vomiting.            Discharge Care Instructions        Start     Ordered   12/10/16 0000  Prenatal Vit-Fe Fumarate-FA (PRENATAL MULTIVITAMIN) TABS tablet  Daily     12/10/16 0745   12/10/16 0000  ibuprofen (ADVIL,MOTRIN) 800 MG tablet  Every 8 hours PRN     12/10/16 0745   12/10/16 0000  Increase activity slowly     12/10/16 0745   12/10/16 0000  Diet - low sodium heart healthy     12/10/16 0745   12/10/16 0000  Discharge instructions    Comments:  Call [phone] with questions or problems   12/10/16 0745   12/10/16 0000  May walk up steps     12/10/16 0745   12/10/16 0000  May shower / Bathe     12/10/16 0745   12/10/16 0000  Lifting restrictions    Comments:  No greater than 10-15lbs for 2-3 weeks   12/10/16 0745   12/10/16 0000  Driving Restrictions    Comments:  While taking strong pain medicine   12/10/16 0745   12/10/16 0000  Sexual Activity Restrictions    Comments:  Pelvic rest - no douching, tampons or sex for 6 weeks   12/10/16 0745   12/10/16 0000  Call MD for:  persistant nausea and vomiting     12/10/16 0745   12/10/16 0000  Call MD for:   severe uncontrolled pain     12/10/16 0745      Diet: routine diet  Activity: Advance as tolerated. Pelvic rest for 6 weeks.   Outpatient follow up:6 weeks Follow up Appt:No future appointments. Follow up Visit:No Follow-up on file.  Postpartum contraception: Undecided  Newborn Data: Live born female  Birth Weight: 8 lb 11.7 oz (3960 g) APGAR: 9, 9  Baby Feeding: Breast Disposition:home with mother   12/10/2016 Janyth Contes, MD

## 2016-12-10 NOTE — Plan of Care (Signed)
Problem: Education: Goal: Knowledge of condition will improve Outcome: Completed/Met Date Met: 12/10/16 Discharge education, safety and follow up reviewed with patient. Patient verbalizes understanding of information.

## 2016-12-10 NOTE — Progress Notes (Signed)
Post Partum Day 2 Subjective: no complaints, up ad lib, voiding, tolerating PO and nl lochia, pain controlled  Objective: Blood pressure 124/86, pulse 100, temperature 99.1 F (37.3 C), temperature source Oral, resp. rate 18, height 5\' 6"  (1.676 m), weight 83.9 kg (185 lb), SpO2 98 %, unknown if currently breastfeeding.  Physical Exam:  General: alert and no distress Lochia: appropriate Uterine Fundus: firm   Recent Labs  12/07/16 2310 12/08/16 0538  HGB 12.4 11.8*  HCT 38.2 36.2    Assessment/Plan: Discharge home, Breastfeeding and Lactation consult.  Routine PP care.  D/c with Motrin and PNV, f/u 6 wks.     LOS: 3 days   Amelianna Meller Bovard-Stuckert 12/10/2016, 7:40 AM

## 2016-12-11 ENCOUNTER — Encounter (HOSPITAL_COMMUNITY): Payer: Self-pay | Admitting: *Deleted

## 2017-01-21 DIAGNOSIS — Z124 Encounter for screening for malignant neoplasm of cervix: Secondary | ICD-10-CM | POA: Diagnosis not present

## 2017-01-21 DIAGNOSIS — Z1151 Encounter for screening for human papillomavirus (HPV): Secondary | ICD-10-CM | POA: Diagnosis not present

## 2017-01-21 DIAGNOSIS — Z3009 Encounter for other general counseling and advice on contraception: Secondary | ICD-10-CM | POA: Diagnosis not present

## 2017-01-21 DIAGNOSIS — Z1389 Encounter for screening for other disorder: Secondary | ICD-10-CM | POA: Diagnosis not present

## 2017-04-20 ENCOUNTER — Telehealth: Payer: 59 | Admitting: Nurse Practitioner

## 2017-04-20 DIAGNOSIS — J111 Influenza due to unidentified influenza virus with other respiratory manifestations: Secondary | ICD-10-CM

## 2017-04-20 MED ORDER — OSELTAMIVIR PHOSPHATE 75 MG PO CAPS: 75.0000 mg | ORAL_CAPSULE | Freq: Every day | ORAL | 0 refills | Status: DC

## 2017-04-20 NOTE — Progress Notes (Signed)

## 2017-05-08 ENCOUNTER — Telehealth: Payer: 59 | Admitting: Family

## 2017-05-08 DIAGNOSIS — J069 Acute upper respiratory infection, unspecified: Secondary | ICD-10-CM | POA: Diagnosis not present

## 2017-05-08 MED ORDER — FLUTICASONE PROPIONATE 50 MCG/ACT NA SUSP: 2.0000 | Freq: Every day | NASAL | 6 refills | Status: DC

## 2017-05-08 NOTE — Progress Notes (Signed)
We are sorry you are not feeling well.  Here is how we plan to help!  Based on what you have shared with me, it looks like you may have a viral upper respiratory infection or a "common cold".  Colds are caused by a large number of viruses; however, rhinovirus is the most common cause.   Symptoms of the common cold vary from person to person, with common symptoms including sore throat, cough, and malaise.  A low-grade fever of 100.4 may present, but is often uncommon.  Symptoms vary however, and are closely related to a person's age or underlying illnesses.  The most common symptoms associated with the common cold are nasal discharge or congestion, cough, sneezing, headache and pressure in the ears and face.  Cold symptoms usually persist for about 3 to 10 days, but can last up to 2 weeks.  It is important to know that colds do not cause serious illness or complications in most cases.    The common cold is transmitted from person to person, with the most common method of transmission being a person's hands.  The virus is able to live on the skin and can infect other persons for up to 2 hours after direct contact.  Also, colds are transmitted when someone coughs or sneezes; thus, it is important to cover the mouth to reduce this risk.  To keep the spread of the common cold at [place], good hand hygiene is very important.  This is an infection that is most likely caused by a virus. There are no specific treatments for the common cold other than to help you with the symptoms until the infection runs its course.    For nasal congestion, you may use an oral decongestants such as Mucinex D or if you have glaucoma or high blood pressure use plain Mucinex.  Saline nasal spray or nasal drops can help and can safely be used as often as needed for congestion.  For your congestion, I have prescribed Fluticasone nasal spray one spray in each nostril twice a day  If you do not have a history of heart disease, hypertension,  diabetes or thyroid disease, prostate/bladder issues or glaucoma, you may also use Sudafed to treat nasal congestion.  It is highly recommended that you consult with a pharmacist or your primary care physician to ensure this medication is safe for you to  Providers prescribe antibiotics to treat infections caused by bacteria. Antibiotics are very powerful in treating bacterial infections when they are used properly. To maintain their effectiveness, they should be used only when necessary. Overuse of antibiotics has resulted in the development of superbugs that are resistant to treatment!    After careful review of your answers, I would not recommend an antibiotic for your condition.  Antibiotics are not effective against viruses and therefore should not be used to treat them. Common examples of infections caused by viruses include colds and flu   If you have a sore or scratchy throat, use a saltwater gargle-  to  teaspoon of salt dissolved in a 4-ounce to 8-ounce glass of warm water.  Gargle the solution for approximately 15-30 seconds and then spit.  It is important not to swallow the solution.  You can also use throat lozenges/cough drops and Chloraseptic spray to help with throat pain or discomfort.  Warm or cold liquids can also be helpful in relieving throat pain.  For headache, pain or general discomfort, you can use Ibuprofen or Tylenol as directed.  Some authorities believe that zinc sprays or the use of Echinacea may shorten the course of your symptoms.   HOME CARE . Only take medications as instructed by your medical team. . Be sure to drink plenty of fluids. Water is fine as well as fruit juices, sodas and electrolyte beverages. You may want to stay away from caffeine or alcohol. If you are nauseated, try taking small sips of liquids. How do you know if you are getting enough fluid? Your urine should be a pale yellow or almost colorless. . Get rest. . Taking a steamy shower or using a  humidifier may help nasal congestion and ease sore throat pain. You can place a towel over your head and breathe in the steam from hot water coming from a faucet. . Using a saline nasal spray works much the same way. . Cough drops, hard candies and sore throat lozenges may ease your cough. . Avoid close contacts especially the very young and the elderly . Cover your mouth if you cough or sneeze . Always remember to wash your hands.   GET HELP RIGHT AWAY IF: . You develop worsening fever. . If your symptoms do not improve within 10 days . You become short of breath. . You develop yellow or green discharge from your nose over 3 days. . You have coughing fits . You develop a severe head ache or visual changes. . You develop shortness of breath or difficulty breathing. . Your symptoms persist after you have completed your treatment plan  MAKE SURE YOU   Understand these instructions.  Will watch your condition.  Will get help right away if you are not doing well or get worse.  Your e-visit answers were reviewed by a board certified advanced clinical practitioner to complete your personal care plan. Depending upon the condition, your plan could have included both over the counter or prescription medications. Please review your pharmacy choice. If there is a problem, you may call our nursing hot line at and have the prescription routed to another pharmacy. Your safety is important to [place]. If you have drug allergies check your prescription carefully.   You can use MyChart to ask questions about today's visit, request a non-urgent call back, or ask for a work or school excuse for 24 hours related to this e-Visit. If it has been greater than 24 hours you will need to follow up with your provider, or enter a new e-Visit to address those concerns. You will get an e-mail in the next two days asking about your experience.  I hope that your e-visit has been valuable and will speed your recovery. Thank  you for using e-visits.

## 2017-07-25 DIAGNOSIS — D224 Melanocytic nevi of scalp and neck: Secondary | ICD-10-CM | POA: Diagnosis not present

## 2017-07-25 DIAGNOSIS — D2272 Melanocytic nevi of left lower limb, including hip: Secondary | ICD-10-CM | POA: Diagnosis not present

## 2017-07-25 DIAGNOSIS — D2230 Melanocytic nevi of unspecified part of face: Secondary | ICD-10-CM | POA: Diagnosis not present

## 2017-07-25 DIAGNOSIS — D2271 Melanocytic nevi of right lower limb, including hip: Secondary | ICD-10-CM | POA: Diagnosis not present

## 2017-07-25 DIAGNOSIS — D225 Melanocytic nevi of trunk: Secondary | ICD-10-CM | POA: Diagnosis not present

## 2018-01-10 ENCOUNTER — Ambulatory Visit (INDEPENDENT_AMBULATORY_CARE_PROVIDER_SITE_OTHER): Payer: Self-pay | Admitting: Nurse Practitioner

## 2018-01-10 VITALS — BP 100/70 | HR 100 | Temp 98.7°F | Resp 16 | Wt 144.0 lb

## 2018-01-10 DIAGNOSIS — B9789 Other viral agents as the cause of diseases classified elsewhere: Secondary | ICD-10-CM

## 2018-01-10 DIAGNOSIS — J029 Acute pharyngitis, unspecified: Secondary | ICD-10-CM

## 2018-01-10 DIAGNOSIS — J019 Acute sinusitis, unspecified: Secondary | ICD-10-CM

## 2018-01-10 MED ORDER — MONTELUKAST SODIUM 10 MG PO TABS: 10.0000 mg | ORAL_TABLET | Freq: Every day | ORAL | 0 refills | Status: DC

## 2018-01-10 MED ORDER — FLUTICASONE PROPIONATE 50 MCG/ACT NA SUSP: 2.0000 | Freq: Every day | NASAL | 0 refills | Status: DC

## 2018-01-10 NOTE — Patient Instructions (Signed)
Sinusitis, Adult -Take medications as described. -Ibuprofen 800 mg every 8 hours for the next 3 days.  May stop if symptoms improve sooner. -Warm salt water gargles as needed 3-4 times daily for throat pain or discomfort. -May use a teaspoon of honey as needed for throat pain or discomfort. -May use a humidifier or vaporizer during sleep and when at home. -Follow-up in the next 5-7 days if symptoms do not improve or sooner if rapidly worsening.  Sinusitis is soreness and inflammation of your sinuses. Sinuses are hollow spaces in the bones around your face. Your sinuses are located:  Around your eyes.  In the middle of your forehead.  Behind your nose.  In your cheekbones.  Your sinuses and nasal passages are lined with a stringy fluid (mucus). Mucus normally drains out of your sinuses. When your nasal tissues become inflamed or swollen, the mucus can become trapped or blocked so air cannot flow through your sinuses. This allows bacteria, viruses, and funguses to grow, which leads to infection. Sinusitis can develop quickly and last for 7?10 days (acute) or for more than 12 weeks (chronic). Sinusitis often develops after a cold. What are the causes? This condition is caused by anything that creates swelling in the sinuses or stops mucus from draining, including:  Allergies.  Asthma.  Bacterial or viral infection.  Abnormally shaped bones between the nasal passages.  Nasal growths that contain mucus (nasal polyps).  Narrow sinus openings.  Pollutants, such as chemicals or irritants in the air.  A foreign object stuck in the nose.  A fungal infection. This is rare.  What increases the risk? The following factors may make you more likely to develop this condition:  Having allergies or asthma.  Having had a recent cold or respiratory tract infection.  Having structural deformities or blockages in your nose or sinuses.  Having a weak immune system.  Doing a lot of  swimming or diving.  Overusing nasal sprays.  Smoking.  What are the signs or symptoms? The main symptoms of this condition are pain and a feeling of pressure around the affected sinuses. Other symptoms include:  Upper toothache.  Earache.  Headache.  Bad breath.  Decreased sense of smell and taste.  A cough that may get worse at night.  Fatigue.  Fever.  Thick drainage from your nose. The drainage is often green and it may contain pus (purulent).  Stuffy nose or congestion.  Postnasal drip. This is when extra mucus collects in the throat or back of the nose.  Swelling and warmth over the affected sinuses.  Sore throat.  Sensitivity to light.  How is this diagnosed? This condition is diagnosed based on symptoms, a medical history, and a physical exam. To find out if your condition is acute or chronic, your health care provider may:  Look in your nose for signs of nasal polyps.  Tap over the affected sinus to check for signs of infection.  View the inside of your sinuses using an imaging device that has a light attached (endoscope).  If your health care provider suspects that you have chronic sinusitis, you may also:  Be tested for allergies.  Have a sample of mucus taken from your nose (nasal culture) and checked for bacteria.  Have a mucus sample examined to see if your sinusitis is related to an allergy.  If your sinusitis does not respond to treatment and it lasts longer than 8 weeks, you may have an MRI or CT scan to check  your sinuses. These scans also help to determine how severe your infection is. In rare cases, a bone biopsy may be done to rule out more serious types of fungal sinus disease. How is this treated? Treatment for sinusitis depends on the cause and whether your condition is chronic or acute. If a virus is causing your sinusitis, your symptoms will go away on their own within 10 days. You may be given medicines to relieve your symptoms,  including:  Topical nasal decongestants. They shrink swollen nasal passages and let mucus drain from your sinuses.  Antihistamines. These drugs block inflammation that is triggered by allergies. This can help to ease swelling in your nose and sinuses.  Topical nasal corticosteroids. These are nasal sprays that ease inflammation and swelling in your nose and sinuses.  Nasal saline washes. These rinses can help to get rid of thick mucus in your nose.  If your condition is caused by bacteria, you will be given an antibiotic medicine. If your condition is caused by a fungus, you will be given an antifungal medicine. Surgery may be needed to correct underlying conditions, such as narrow nasal passages. Surgery may also be needed to remove polyps. Follow these instructions at home: Medicines  Take, use, or apply over-the-counter and prescription medicines only as told by your health care provider. These may include nasal sprays.  If you were prescribed an antibiotic medicine, take it as told by your health care provider. Do not stop taking the antibiotic even if you start to feel better. Hydrate and Humidify  Drink enough water to keep your urine clear or pale yellow. Staying hydrated will help to thin your mucus.  Use a cool mist humidifier to keep the humidity level in your home above 50%.  Inhale steam for 10-15 minutes, 3-4 times a day or as told by your health care provider. You can do this in the bathroom while a hot shower is running.  Limit your exposure to cool or dry air. Rest  Rest as much as possible.  Sleep with your head raised (elevated).  Make sure to get enough sleep each night. General instructions  Apply a warm, moist washcloth to your face 3-4 times a day or as told by your health care provider. This will help with discomfort.  Wash your hands often with soap and water to reduce your exposure to viruses and other germs. If soap and water are not available, use hand  sanitizer.  Do not smoke. Avoid being around people who are smoking (secondhand smoke).  Keep all follow-up visits as told by your health care provider. This is important. Contact a health care provider if:  You have a fever.  Your symptoms get worse.  Your symptoms do not improve within 10 days. Get help right away if:  You have a severe headache.  You have persistent vomiting.  You have pain or swelling around your face or eyes.  You have vision problems.  You develop confusion.  Your neck is stiff.  You have trouble breathing. This information is not intended to replace advice given to you by your health care provider. Make sure you discuss any questions you have with your health care provider. Document Released: 03/05/2005 Document Revised: 10/30/2015 Document Reviewed: 12/29/2014 Elsevier Interactive Patient Education  2018 [place].  Pharyngitis Pharyngitis is redness, pain, and swelling (inflammation) of the throat (pharynx). It is a very common cause of sore throat. Pharyngitis can be caused by a bacteria, but it is usually caused  by a virus. Most cases of pharyngitis get better on their own without treatment. What are the causes? This condition may be caused by:  Infection by viruses (viral). Viral pharyngitis spreads from person to person (is contagious) through coughing, sneezing, and sharing of personal items or utensils such as cups, forks, spoons, and toothbrushes.  Infection by bacteria (bacterial). Bacterial pharyngitis may be spread by touching the nose or face after coming in contact with the bacteria, or through more intimate contact, such as kissing.  Allergies. Allergies can cause buildup of mucus in the throat (post-nasal drip), leading to inflammation and irritation. Allergies can also cause blocked nasal passages, forcing breathing through the mouth, which dries and irritates the throat.  What increases the risk? You are more likely to develop  this condition if:  You are 47-55 years old.  You are exposed to crowded environments such as daycare, school, or dormitory living.  You live in a cold climate.  You have a weakened disease-fighting (immune) system.  What are the signs or symptoms? Symptoms of this condition vary by the cause (viral, bacterial, or allergies) and can include:  Sore throat.  Fatigue.  Low-grade fever.  Headache.  Joint pain and muscle aches.  Skin rashes.  Swollen glands in the throat (lymph nodes).  Plaque-like film on the throat or tonsils. This is often a symptom of bacterial pharyngitis.  Vomiting.  Stuffy nose (nasal congestion).  Cough.  Red, itchy eyes (conjunctivitis).  Loss of appetite.  How is this diagnosed? This condition is often diagnosed based on your medical history and a physical exam. Your health care provider will ask you questions about your illness and your symptoms. A swab of your throat may be done to check for bacteria (rapid strep test). Other lab tests may also be done, depending on the suspected cause, but these are rare. How is this treated? This condition usually gets better in 3-4 days without medicine. Bacterial pharyngitis may be treated with antibiotic medicines. Follow these instructions at home:  Take over-the-counter and prescription medicines only as told by your health care provider. ? If you were prescribed an antibiotic medicine, take it as told by your health care provider. Do not stop taking the antibiotic even if you start to feel better. ? Do not give children aspirin because of the association with Reye syndrome.  Drink enough water and fluids to keep your urine clear or pale yellow.  Get a lot of rest.  Gargle with a salt-water mixture 3-4 times a day or as needed. To make a salt-water mixture, completely dissolve -1 tsp of salt in 1 cup of warm water.  If your health care provider approves, you may use throat lozenges or sprays to  soothe your throat. Contact a health care provider if:  You have large, tender lumps in your neck.  You have a rash.  You cough up green, yellow-brown, or bloody spit. Get help right away if:  Your neck becomes stiff.  You drool or are unable to swallow liquids.  You cannot drink or take medicines without vomiting.  You have severe pain that does not go away, even after you take medicine.  You have trouble breathing, and it is not caused by a stuffy nose.  You have new pain and swelling in your joints such as the knees, ankles, wrists, or elbows. Summary  Pharyngitis is redness, pain, and swelling (inflammation) of the throat (pharynx).  While pharyngitis can be caused by a bacteria, the most  common causes are viral.  Most cases of pharyngitis get better on their own without treatment.  Bacterial pharyngitis is treated with antibiotic medicines. This information is not intended to replace advice given to you by your health care provider. Make sure you discuss any questions you have with your health care provider. Document Released: 03/05/2005 Document Revised: 04/10/2016 Document Reviewed: 04/10/2016 Elsevier Interactive Patient Education  [employer].

## 2018-01-10 NOTE — Progress Notes (Signed)
Subjective:  Paula Wilkins is a 37 y.o. female who presents for evaluation of possible sinusitis.  Symptoms include bilateral ear pressure/pain, nasal congestion, no fever and sinus pressure and sore throat.  Onset of symptoms was 1 day ago, and has been gradually worsening since that time. Patient rates current throat pain 5/10. Treatment to date:  decongestants.  High risk factors for influenza complications:  none.  The following portions of the patient's history were reviewed and updated as appropriate:  allergies, current medications and past medical history.  Constitutional: positive for fatigue, negative for anorexia, chills, fevers and malaise Eyes: negative Ears, nose, mouth, throat, and face: positive for nasal congestion, sore throat and bilateral ear fullness/pressure, negative for ear drainage, earaches and hoarseness Respiratory: negative Cardiovascular: negative Gastrointestinal: negative Neurological: positive for headaches, negative for coordination problems, dizziness, paresthesia, seizures, speech problems, tremors, vertigo and weakness Allergic/Immunologic: positive for hay fever Objective:  BP 100/70 (BP Location: Right Arm, Patient Position: Sitting, Cuff Size: Normal)   Pulse 100   Temp 98.7 F (37.1 C) (Oral)   Resp 16   Wt 144 lb (65.3 kg)   SpO2 99%   BMI 23.24 kg/m  General appearance: alert, cooperative and no distress Head: Normocephalic, without obvious abnormality, atraumatic Eyes: conjunctivae/corneas clear. PERRL, EOM's intact. Fundi benign. Ears: abnormal TM right ear - mucoid middle ear fluid and abnormal TM left ear - mucoid middle ear fluid Nose: no discharge, mild congestion, turbinates swollen, inflamed, mild maxillary sinus tenderness bilateral, mild frontal sinus tenderness bilateral Throat: abnormal findings: moderate oropharyngeal erythema and mild oropharyngeal edema, no exudate Lungs: clear to auscultation bilaterally Heart: regular rate and  rhythm, S1, S2 normal, no murmur, click, rub or gallop Abdomen: soft, non-tender; bowel sounds normal; no masses,  no organomegaly Pulses: 2+ and symmetric Skin: Skin color, texture, turgor normal. No rashes or lesions Lymph nodes: cervical and submandibular nodes normal Neurologic: Grossly normal    Assessment:  Viral Sinusitis    Plan:   Exam findings, diagnosis etiology and medication use and indications reviewed with patient. Follow- Up and discharge instructions provided. No emergent/urgent issues found on exam. Patient education was provided. Patient verbalized understanding of information provided and agrees with plan of care (POC), all questions answered. The patient is advised to call or return to clinic if condition does not see an improvement in symptoms, or to seek the care of the closest emergency department if condition worsens with the above plan.   1. Acute viral sinusitis  - fluticasone (FLONASE) 50 MCG/ACT nasal spray; Place 2 sprays into both nostrils daily for 10 days.  Dispense: 16 g; Refill: 0 - montelukast (SINGULAIR) 10 MG tablet; Take 1 tablet (10 mg total) by mouth at bedtime.  Dispense: 30 tablet; Refill: 0 Take medications as described. -Ibuprofen 800 mg every 8 hours for the next 3 days.  May stop if symptoms improve sooner. -Warm salt water gargles as needed 3-4 times daily for throat pain or discomfort. -May use a teaspoon of honey as needed for throat pain or discomfort. -May use a humidifier or vaporizer during sleep and when at home. -Follow-up in the next 5-7 days if symptoms do not improve or sooner if rapidly worsening  2. Acute pharyngitis, unspecified etiology  Take medications as described. -Ibuprofen 800 mg every 8 hours for the next 3 days.  May stop if symptoms improve sooner. -Warm salt water gargles as needed 3-4 times daily for throat pain or discomfort. -May use a teaspoon of honey  as needed for throat pain or discomfort. -May use a  humidifier or vaporizer during sleep and when at home. -Follow-up in the next 5-7 days if symptoms do not improve or sooner if rapidly worsening

## 2018-01-13 ENCOUNTER — Telehealth: Payer: Self-pay

## 2018-01-13 NOTE — Telephone Encounter (Signed)
Patient was not available.

## 2018-01-16 ENCOUNTER — Telehealth: Payer: 59 | Admitting: Family

## 2018-01-16 DIAGNOSIS — B9689 Other specified bacterial agents as the cause of diseases classified elsewhere: Secondary | ICD-10-CM | POA: Diagnosis not present

## 2018-01-16 DIAGNOSIS — J028 Acute pharyngitis due to other specified organisms: Secondary | ICD-10-CM

## 2018-01-16 MED ORDER — AZITHROMYCIN 250 MG PO TABS: ORAL_TABLET | ORAL | 0 refills | Status: DC

## 2018-01-16 MED ORDER — BENZONATATE 100 MG PO CAPS: 100.0000 mg | ORAL_CAPSULE | Freq: Three times a day (TID) | ORAL | 0 refills | Status: DC | PRN

## 2018-01-16 MED FILL — AZITHROMYCIN 250 MG TABLET: 250 | 5 days supply | Qty: 6 | Fill #0

## 2018-01-16 MED FILL — BENZONATATE 100 MG CAP: 100 | 5 days supply | Qty: 30 | Fill #0

## 2018-01-16 NOTE — Progress Notes (Signed)

## 2019-02-06 ENCOUNTER — Encounter: Payer: Self-pay | Admitting: Internal Medicine

## 2019-02-06 ENCOUNTER — Telehealth: Payer: No Typology Code available for payment source | Admitting: Nurse Practitioner

## 2019-02-06 ENCOUNTER — Telehealth: Payer: Self-pay | Admitting: Internal Medicine

## 2019-02-06 DIAGNOSIS — B9789 Other viral agents as the cause of diseases classified elsewhere: Secondary | ICD-10-CM

## 2019-02-06 DIAGNOSIS — J329 Chronic sinusitis, unspecified: Secondary | ICD-10-CM | POA: Diagnosis not present

## 2019-02-06 MED ORDER — AZELASTINE HCL 0.1 % NA SOLN: 2.0000 | Freq: Two times a day (BID) | NASAL | 0 refills | Status: DC

## 2019-02-06 MED ORDER — CETIRIZINE HCL 10 MG PO TABS: 10.0000 mg | ORAL_TABLET | Freq: Every day | ORAL | 0 refills | Status: AC

## 2019-02-06 MED ORDER — AMOXICILLIN 875 MG PO TABS: 875.0000 mg | ORAL_TABLET | Freq: Two times a day (BID) | ORAL | 0 refills | Status: AC

## 2019-02-06 NOTE — Telephone Encounter (Signed)
Patient called.  She has sinus congestion for 4-5 days.  No fever  No ill contacts No other s/s of coronavirus.   She has taken mucinex without relief No allergies, no meds  A/p presumed sinusitis- amoxil, see rx.

## 2019-02-06 NOTE — Progress Notes (Signed)
We are sorry that you are not feeling well.  Here is how we plan to help!  Based on what you have shared with me it looks like you have sinusitis.  Sinusitis is inflammation and infection in the sinus cavities of the head.  Based on your presentation I believe you most likely have Acute Viral Sinusitis.This is an infection most likely caused by a virus. There is not specific treatment for viral sinusitis other than to help you with the symptoms until the infection runs its course.  You may use an oral decongestant such as Mucinex D or if you have glaucoma or high blood pressure use plain Mucinex. Saline nasal spray help and can safely be used as often as needed for congestion, I have prescribed: Azelastine nasal spray 2 sprays in each nostril twice a day.  I am also providing a prescription for Zyrtec (Ceterizine) 10mg  tablets, take one tablet daily while symptoms persist.  Based on your symptoms, antibiotics are not recommended at this time.  However, if you experience fever, a rapid worsening of symptoms, symptoms persist for more than 10 days, antibiotics would be recommended.    Some authorities believe that zinc sprays or the use of Echinacea may shorten the course of your symptoms.  Sinus infections are not as easily transmitted as other respiratory infection, however we still recommend that you avoid close contact with loved ones, especially the very young and elderly.  Remember to wash your hands thoroughly throughout the day as this is the number one way to prevent the spread of infection!  Home Care:  Only take medications as instructed by your medical team.  Do not take these medications with alcohol.  A steam or ultrasonic humidifier can help congestion.  You can place a towel over your head and breathe in the steam from hot water coming from a faucet.  Avoid close contacts especially the very young and the elderly.  Cover your mouth when you cough or sneeze.  Always remember to wash  your hands.  Get Help Right Away If:  You develop worsening fever or sinus pain.  You develop a severe head ache or visual changes.  Your symptoms persist after you have completed your treatment plan.  Make sure you  Understand these instructions.  Will watch your condition.  Will get help right away if you are not doing well or get worse.  Your e-visit answers were reviewed by a board certified advanced clinical practitioner to complete your personal care plan.  Depending on the condition, your plan could have included both over the counter or prescription medications.  If there is a problem please reply  once you have received a response from your provider.  Your safety is important to [place].  If you have drug allergies check your prescription carefully.    You can use MyChart to ask questions about today's visit, request a non-urgent call back, or ask for a work or school excuse for 24 hours related to this e-Visit. If it has been greater than 24 hours you will need to follow up with your provider, or enter a new e-Visit to address those concerns.  You will get an e-mail in the next two days asking about your experience.  I hope that your e-visit has been valuable and will speed your recovery. Thank you for using e-visits.  I have spent at least 5 minutes reviewing and documenting in the patient's chart.

## 2019-03-05 ENCOUNTER — Other Ambulatory Visit: Payer: Self-pay | Admitting: Nurse Practitioner

## 2019-03-05 DIAGNOSIS — J329 Chronic sinusitis, unspecified: Secondary | ICD-10-CM

## 2019-03-05 DIAGNOSIS — B9789 Other viral agents as the cause of diseases classified elsewhere: Secondary | ICD-10-CM

## 2019-03-09 ENCOUNTER — Other Ambulatory Visit: Payer: Self-pay

## 2019-08-18 ENCOUNTER — Telehealth: Payer: Self-pay | Admitting: Physician Assistant

## 2019-08-18 DIAGNOSIS — J329 Chronic sinusitis, unspecified: Secondary | ICD-10-CM

## 2019-08-18 MED ORDER — AMOXICILLIN-POT CLAVULANATE 875-125 MG PO TABS: 1.0000 | ORAL_TABLET | Freq: Two times a day (BID) | ORAL | 0 refills | Status: AC

## 2019-08-18 MED FILL — AMOX-CLAV 875-125 MG TABLET: 875-125 | 7 days supply | Qty: 14 | Fill #0

## 2019-08-18 NOTE — Progress Notes (Signed)

## 2020-02-02 ENCOUNTER — Telehealth: Payer: Self-pay | Admitting: Emergency Medicine

## 2020-02-02 ENCOUNTER — Other Ambulatory Visit: Payer: Self-pay | Admitting: Emergency Medicine

## 2020-02-02 DIAGNOSIS — R0981 Nasal congestion: Secondary | ICD-10-CM

## 2020-02-02 MED ORDER — AMOXICILLIN-POT CLAVULANATE 875-125 MG PO TABS: 1.0000 | ORAL_TABLET | Freq: Two times a day (BID) | ORAL | 0 refills | Status: DC

## 2020-02-02 MED FILL — AMOX-CLAV 875-125 MG TABLET: 875-125 | 7 days supply | Qty: 14 | Fill #0

## 2020-02-02 NOTE — Progress Notes (Signed)
We are sorry that you are not feeling well.  Here is how we plan to help!  Based on what you have shared with me it looks like you have sinusitis.  Sinusitis is inflammation and infection in the sinus cavities of the head.  Based on your presentation I believe you most likely have Acute Viral Sinusitis.This is an infection most likely caused by a virus. There is not specific treatment for viral sinusitis other than to help you with the symptoms until the infection runs its course.  You may use an oral decongestant such as Mucinex D or if you have glaucoma or high blood pressure use plain Mucinex. Saline nasal spray help and can safely be used as often as needed for congestion.  Sinus infections are generally not treated with and antibiotic until a person has been sick for about 10 days, has a significant fever, or has had improvement of symptoms, then gets suddenly worse.  I'll send a prescription for Augmentin, but I strongly recommend not having it filled until the 7-10 day mark from your onset of symptoms.  If you take it before then, it's unlikely to help.  Some authorities believe that zinc sprays or the use of Echinacea may shorten the course of your symptoms.  Sinus infections are not as easily transmitted as other respiratory infection, however we still recommend that you avoid close contact with loved ones, especially the very young and elderly.  Remember to wash your hands thoroughly throughout the day as this is the number one way to prevent the spread of infection!  Home Care:  Only take medications as instructed by your medical team.  Do not take these medications with alcohol.  A steam or ultrasonic humidifier can help congestion.  You can place a towel over your head and breathe in the steam from hot water coming from a faucet.  Avoid close contacts especially the very young and the elderly.  Cover your mouth when you cough or sneeze.  Always remember to wash your hands.  Get  Help Right Away If:  You develop worsening fever or sinus pain.  You develop a severe head ache or visual changes.  Your symptoms persist after you have completed your treatment plan.  Make sure you  Understand these instructions.  Will watch your condition.  Will get help right away if you are not doing well or get worse.  Your e-visit answers were reviewed by a board certified advanced clinical practitioner to complete your personal care plan.  Depending on the condition, your plan could have included both over the counter or prescription medications.  If there is a problem please reply  once you have received a response from your provider.  Your safety is important to [place].  If you have drug allergies check your prescription carefully.    You can use MyChart to ask questions about today's visit, request a non-urgent call back, or ask for a work or school excuse for 24 hours related to this e-Visit. If it has been greater than 24 hours you will need to follow up with your provider, or enter a new e-Visit to address those concerns.  You will get an e-mail in the next two days asking about your experience.  I hope that your e-visit has been valuable and will speed your recovery. Thank you for using e-visits.  Approximately 5 minutes was used in reviewing the patient's chart, questionnaire, prescribing medications, and documentation.

## 2020-05-17 ENCOUNTER — Other Ambulatory Visit: Payer: Self-pay | Admitting: Physician Assistant

## 2020-05-17 ENCOUNTER — Telehealth: Payer: No Typology Code available for payment source | Admitting: Physician Assistant

## 2020-05-17 DIAGNOSIS — J329 Chronic sinusitis, unspecified: Secondary | ICD-10-CM

## 2020-05-17 MED ORDER — AMOXICILLIN-POT CLAVULANATE 875-125 MG PO TABS: 1.0000 | ORAL_TABLET | Freq: Two times a day (BID) | ORAL | 0 refills | Status: DC

## 2020-05-17 MED FILL — AMOX TR-K CLV 875-125 MG TA: 875-125 | 10 days supply | Qty: 20 | Fill #0

## 2020-05-17 NOTE — Progress Notes (Signed)
We are sorry that you are not feeling well.  Here is how we plan to help!  Based on what you have shared with me it looks like you have sinusitis.  Sinusitis is inflammation and infection in the sinus cavities of the head.  Based on your presentation I believe you most likely have Acute Bacterial Sinusitis, however in light of the recent COVID pandemic, I recommend that you get tested for COVID to rule this out as a possible diagnosis.    Acute bacterial sinusitis is an infection caused by bacteria and is treated with antibiotics. I have prescribed Augmentin 875mg /125mg  one tablet twice daily with food, for 7 days. You may continue to use an oral decongestant such as Mucinex D or if you have glaucoma or high blood pressure use plain Mucinex. Saline nasal spray help and can safely be used as often as needed for congestion.  If you develop worsening sinus pain, fever or notice severe headache and vision changes, or if symptoms are not better after completion of antibiotic, please schedule an appointment with a health care provider.    Sinus infections are not as easily transmitted as other respiratory infection, however we still recommend that you avoid close contact with loved ones, especially the very young and elderly.  Remember to wash your hands thoroughly throughout the day as this is the number one way to prevent the spread of infection!  Home Care:  Only take medications as instructed by your medical team.  Complete the entire course of an antibiotic.  Do not take these medications with alcohol.  A steam or ultrasonic humidifier can help congestion.  You can place a towel over your head and breathe in the steam from hot water coming from a faucet.  Avoid close contacts especially the very young and the elderly.  Cover your mouth when you cough or sneeze.  Always remember to wash your hands.  Get Help Right Away If:  You develop worsening fever or sinus pain.  You develop a severe  head ache or visual changes.  Your symptoms persist after you have completed your treatment plan.  Make sure you  Understand these instructions.  Will watch your condition.  Will get help right away if you are not doing well or get worse.  Your e-visit answers were reviewed by a board certified advanced clinical practitioner to complete your personal care plan.  Depending on the condition, your plan could have included both over the counter or prescription medications.  If there is a problem please reply  once you have received a response from your provider.  Your safety is important to [place].  If you have drug allergies check your prescription carefully.    You can use MyChart to ask questions about today's visit, request a non-urgent call back, or ask for a work or school excuse for 24 hours related to this e-Visit. If it has been greater than 24 hours you will need to follow up with your provider, or enter a new e-Visit to address those concerns.  You will get an e-mail in the next two days asking about your experience.  I hope that your e-visit has been valuable and will speed your recovery. Thank you for using e-visits.   Approximately 5 minutes was spent documenting and reviewing patient's chart.

## 2020-11-30 ENCOUNTER — Telehealth: Payer: No Typology Code available for payment source | Admitting: Emergency Medicine

## 2020-11-30 ENCOUNTER — Other Ambulatory Visit (HOSPITAL_BASED_OUTPATIENT_CLINIC_OR_DEPARTMENT_OTHER): Payer: Self-pay

## 2020-11-30 DIAGNOSIS — J329 Chronic sinusitis, unspecified: Secondary | ICD-10-CM

## 2020-11-30 MED ORDER — AMOXICILLIN-POT CLAVULANATE 875-125 MG PO TABS
1.0000 | ORAL_TABLET | Freq: Two times a day (BID) | ORAL | 0 refills | Status: DC
  Filled 2020-11-30: qty 14, 7d supply, fill #0

## 2020-11-30 NOTE — Progress Notes (Signed)

## 2021-01-20 ENCOUNTER — Telehealth: Payer: No Typology Code available for payment source | Admitting: Nurse Practitioner

## 2021-01-20 ENCOUNTER — Other Ambulatory Visit (HOSPITAL_BASED_OUTPATIENT_CLINIC_OR_DEPARTMENT_OTHER): Payer: Self-pay

## 2021-01-20 DIAGNOSIS — J0101 Acute recurrent maxillary sinusitis: Secondary | ICD-10-CM | POA: Diagnosis not present

## 2021-01-20 MED ORDER — DOXYCYCLINE HYCLATE 100 MG PO TABS
100.0000 mg | ORAL_TABLET | Freq: Two times a day (BID) | ORAL | 0 refills | Status: DC
  Filled 2021-01-20: qty 20, 10d supply, fill #0

## 2021-01-20 NOTE — Progress Notes (Signed)

## 2021-01-29 ENCOUNTER — Telehealth: Payer: No Typology Code available for payment source | Admitting: Physician Assistant

## 2021-01-29 DIAGNOSIS — Z20828 Contact with and (suspected) exposure to other viral communicable diseases: Secondary | ICD-10-CM

## 2021-01-29 MED ORDER — OSELTAMIVIR PHOSPHATE 75 MG PO CAPS: 75.0000 mg | ORAL_CAPSULE | Freq: Every day | ORAL | 0 refills | Status: DC

## 2021-01-29 NOTE — Progress Notes (Signed)
E visit for Flu like symptoms   We are sorry that you are not feeling well.  Here is how we plan to help! Based on what you have shared with me it looks like you may have had a flu exposure.  Influenza or "the flu" is   an infection caused by a respiratory virus. The flu virus is highly contagious and persons who did not receive their yearly flu vaccination may "catch" the flu from close contact.  We have anti-viral medications to treat the viruses that cause this infection. They are not a "cure" and only shorten the course of the infection. These prescriptions are most effective when they are given within the first 2 days of "flu" symptoms. Antiviral medication are indicated if you have a high risk of complications from the flu. You should  also consider an antiviral medication if you are in close contact with someone who is at risk. These medications can help patients avoid complications from the flu  but have side effects that you should know. Possible side effects from Tamiflu or oseltamivir include nausea, vomiting, diarrhea, dizziness, headaches, eye redness, sleep problems or other respiratory symptoms. You should not take Tamiflu if you have an allergy to oseltamivir or any to the ingredients in Tamiflu.  Based upon your symptoms and potential risk factors Tamiflu 75mg  once daily for 10 days as prophylaxis. If you develop symptoms, start taking twice daily.   ANYONE WHO HAS FLU SYMPTOMS SHOULD: Stay home. The flu is highly contagious and going out or to work exposes others! Be sure to drink plenty of fluids. Water is fine as well as fruit juices, sodas and electrolyte beverages. You may want to stay away from caffeine or alcohol. If you are nauseated, try taking small sips of liquids. How do you know if you are getting enough fluid? Your urine should be a pale yellow or almost colorless. Get rest. Taking a steamy shower or using a humidifier may help nasal congestion and ease sore throat  pain. Using a saline nasal spray works much the same way. Cough drops, hard candies and sore throat lozenges may ease your cough. Line up a caregiver. Have someone check on you regularly.   GET HELP RIGHT AWAY IF: You cannot keep down liquids or your medications. You become short of breath Your fell like you are going to pass out or loose consciousness. Your symptoms persist after you have completed your treatment plan MAKE SURE YOU  Understand these instructions. Will watch your condition. Will get help right away if you are not doing well or get worse.  Your e-visit answers were reviewed by a board certified advanced clinical practitioner to complete your personal care plan.  Depending on the condition, your plan could have included both over the counter or prescription medications.  If there is a problem please reply  once you have received a response from your provider.  Your safety is important to [place].  If you have drug allergies check your prescription carefully.    You can use MyChart to ask questions about today's visit, request a non-urgent call back, or ask for a work or school excuse for 24 hours related to this e-Visit. If it has been greater than 24 hours you will need to follow up with your provider, or enter a new e-Visit to address those concerns.  You will get an e-mail in the next two days asking about your experience.  I hope that your e-visit has been valuable and  will speed your recovery. Thank you for using e-visits.   I provided 5 minutes of non face-to-face time during this encounter for chart review and documentation.

## 2021-02-27 ENCOUNTER — Other Ambulatory Visit (HOSPITAL_BASED_OUTPATIENT_CLINIC_OR_DEPARTMENT_OTHER): Payer: Self-pay

## 2021-02-27 MED ORDER — COVID-19 AT HOME ANTIGEN TEST VI KIT
PACK | 0 refills | Status: DC
  Filled 2021-02-27: qty 3, 6d supply, fill #0

## 2021-03-09 ENCOUNTER — Other Ambulatory Visit (HOSPITAL_BASED_OUTPATIENT_CLINIC_OR_DEPARTMENT_OTHER): Payer: Self-pay

## 2021-03-09 ENCOUNTER — Telehealth: Payer: No Typology Code available for payment source | Admitting: Physician Assistant

## 2021-03-09 DIAGNOSIS — B9689 Other specified bacterial agents as the cause of diseases classified elsewhere: Secondary | ICD-10-CM | POA: Diagnosis not present

## 2021-03-09 DIAGNOSIS — J019 Acute sinusitis, unspecified: Secondary | ICD-10-CM | POA: Diagnosis not present

## 2021-03-09 MED ORDER — AMOXICILLIN-POT CLAVULANATE 875-125 MG PO TABS
1.0000 | ORAL_TABLET | Freq: Two times a day (BID) | ORAL | 0 refills | Status: DC
  Filled 2021-03-09: qty 14, 7d supply, fill #0

## 2021-03-09 NOTE — Progress Notes (Signed)

## 2021-03-20 ENCOUNTER — Other Ambulatory Visit (HOSPITAL_BASED_OUTPATIENT_CLINIC_OR_DEPARTMENT_OTHER): Payer: Self-pay

## 2021-03-20 MED ORDER — CARESTART COVID-19 HOME TEST VI KIT
PACK | 0 refills | Status: DC
  Filled 2021-03-20: qty 2, 2d supply, fill #0

## 2021-05-03 ENCOUNTER — Other Ambulatory Visit (HOSPITAL_BASED_OUTPATIENT_CLINIC_OR_DEPARTMENT_OTHER): Payer: Self-pay

## 2021-05-03 MED ORDER — CARESTART COVID-19 HOME TEST VI KIT
PACK | 11 refills | Status: DC
  Filled 2021-05-03: qty 4, 4d supply, fill #0

## 2021-05-15 ENCOUNTER — Other Ambulatory Visit (HOSPITAL_BASED_OUTPATIENT_CLINIC_OR_DEPARTMENT_OTHER): Payer: Self-pay

## 2021-05-15 ENCOUNTER — Telehealth: Payer: No Typology Code available for payment source | Admitting: Physician Assistant

## 2021-05-15 DIAGNOSIS — J019 Acute sinusitis, unspecified: Secondary | ICD-10-CM | POA: Diagnosis not present

## 2021-05-15 DIAGNOSIS — B9689 Other specified bacterial agents as the cause of diseases classified elsewhere: Secondary | ICD-10-CM | POA: Diagnosis not present

## 2021-05-15 MED ORDER — AMOXICILLIN-POT CLAVULANATE 875-125 MG PO TABS
1.0000 | ORAL_TABLET | Freq: Two times a day (BID) | ORAL | 0 refills | Status: DC
  Filled 2021-05-15: qty 14, 7d supply, fill #0

## 2021-05-15 NOTE — Progress Notes (Signed)

## 2021-07-03 ENCOUNTER — Telehealth: Payer: No Typology Code available for payment source | Admitting: Physician Assistant

## 2021-07-03 ENCOUNTER — Other Ambulatory Visit (HOSPITAL_BASED_OUTPATIENT_CLINIC_OR_DEPARTMENT_OTHER): Payer: Self-pay

## 2021-07-03 DIAGNOSIS — S50369A Insect bite (nonvenomous) of unspecified elbow, initial encounter: Secondary | ICD-10-CM

## 2021-07-03 DIAGNOSIS — L0390 Cellulitis, unspecified: Secondary | ICD-10-CM

## 2021-07-03 DIAGNOSIS — W57XXXA Bitten or stung by nonvenomous insect and other nonvenomous arthropods, initial encounter: Secondary | ICD-10-CM | POA: Diagnosis not present

## 2021-07-03 MED ORDER — CEPHALEXIN 500 MG PO CAPS
500.0000 mg | ORAL_CAPSULE | Freq: Four times a day (QID) | ORAL | 0 refills | Status: DC
  Filled 2021-07-03: qty 20, 5d supply, fill #0

## 2021-07-03 NOTE — Progress Notes (Signed)
E Visit for Cellulitis  We are sorry that you are not feeling well. Here is how we plan to help!  Based on what you shared with me it looks like you have cellulitis.  Cellulitis looks like areas of skin redness, swelling, and warmth; it develops as a result of bacteria entering under the skin. Little red spots and/or bleeding can be seen in skin, and tiny surface sacs containing fluid can occur. Fever can be present. Cellulitis is almost always on one side of a body, and the lower limbs are the most common site of involvement.   I have prescribed:  Keflex 500mg take one by mouth four times a day for 5 days  HOME CARE:  Take your medications as ordered and take all of them, even if the skin irritation appears to be healing.   GET HELP RIGHT AWAY IF:  Symptoms that don't begin to go away within 48 hours. Severe redness persists or worsens If the area turns color, spreads or swells. If it blisters and opens, develops yellow-brown crust or bleeds. You develop a fever or chills. If the pain increases or becomes unbearable.  Are unable to keep fluids and food down.  MAKE SURE YOU   Understand these instructions. Will watch your condition. Will get help right away if you are not doing well or get worse.  Thank you for choosing an e-visit.  Your e-visit answers were reviewed by a board certified advanced clinical practitioner to complete your personal care plan. Depending upon the condition, your plan could have included both over the counter or prescription medications.  Please review your pharmacy choice. Make sure the pharmacy is open so you can pick up prescription now. If there is a problem, you may contact your provider through MyChart messaging and have the prescription routed to another pharmacy.  Your safety is important to us. If you have drug allergies check your prescription carefully.   For the next 24 hours you can use MyChart to ask questions about today's visit, request a  non-urgent call back, or ask for a work or school excuse. You will get an email in the next two days asking about your experience. I hope that your e-visit has been valuable and will speed your recovery.  I provided 5 minutes of non face-to-face time during this encounter for chart review and documentation.   

## 2021-10-07 ENCOUNTER — Telehealth: Payer: No Typology Code available for payment source | Admitting: Nurse Practitioner

## 2021-10-07 DIAGNOSIS — J0101 Acute recurrent maxillary sinusitis: Secondary | ICD-10-CM | POA: Diagnosis not present

## 2021-10-07 MED ORDER — AMOXICILLIN-POT CLAVULANATE 875-125 MG PO TABS: 1.0000 | ORAL_TABLET | Freq: Two times a day (BID) | ORAL | 0 refills | Status: DC

## 2021-10-07 NOTE — Progress Notes (Signed)

## 2021-11-22 ENCOUNTER — Telehealth: Payer: No Typology Code available for payment source | Admitting: Physician Assistant

## 2021-11-22 ENCOUNTER — Other Ambulatory Visit (HOSPITAL_BASED_OUTPATIENT_CLINIC_OR_DEPARTMENT_OTHER): Payer: Self-pay

## 2021-11-22 DIAGNOSIS — J019 Acute sinusitis, unspecified: Secondary | ICD-10-CM

## 2021-11-22 DIAGNOSIS — B9689 Other specified bacterial agents as the cause of diseases classified elsewhere: Secondary | ICD-10-CM

## 2021-11-22 MED ORDER — AMOXICILLIN-POT CLAVULANATE 875-125 MG PO TABS
1.0000 | ORAL_TABLET | Freq: Two times a day (BID) | ORAL | 0 refills | Status: DC
  Filled 2021-11-22: qty 14, 7d supply, fill #0

## 2021-11-22 NOTE — Progress Notes (Signed)

## 2021-12-27 ENCOUNTER — Other Ambulatory Visit (HOSPITAL_BASED_OUTPATIENT_CLINIC_OR_DEPARTMENT_OTHER): Payer: Self-pay

## 2022-03-06 ENCOUNTER — Other Ambulatory Visit (HOSPITAL_BASED_OUTPATIENT_CLINIC_OR_DEPARTMENT_OTHER): Payer: Self-pay

## 2022-03-06 ENCOUNTER — Telehealth: Payer: No Typology Code available for payment source | Admitting: Family Medicine

## 2022-03-06 DIAGNOSIS — B9689 Other specified bacterial agents as the cause of diseases classified elsewhere: Secondary | ICD-10-CM

## 2022-03-06 DIAGNOSIS — J019 Acute sinusitis, unspecified: Secondary | ICD-10-CM | POA: Diagnosis not present

## 2022-03-06 MED ORDER — DOXYCYCLINE HYCLATE 100 MG PO TABS
100.0000 mg | ORAL_TABLET | Freq: Two times a day (BID) | ORAL | 0 refills | Status: AC
  Filled 2022-03-06: qty 20, 10d supply, fill #0

## 2022-03-06 NOTE — Progress Notes (Signed)

## 2022-03-15 ENCOUNTER — Other Ambulatory Visit: Payer: Self-pay | Admitting: Obstetrics and Gynecology

## 2022-03-15 DIAGNOSIS — R928 Other abnormal and inconclusive findings on diagnostic imaging of breast: Secondary | ICD-10-CM

## 2022-03-20 ENCOUNTER — Ambulatory Visit: Payer: 59

## 2022-03-20 ENCOUNTER — Ambulatory Visit
Admission: RE | Admit: 2022-03-20 | Discharge: 2022-03-20 | Disposition: A | Discharge status: Home / Self Care | Payer: 59 | Source: Ambulatory Visit | Attending: Obstetrics and Gynecology | Admitting: Obstetrics and Gynecology

## 2022-03-20 ENCOUNTER — Other Ambulatory Visit: Payer: Self-pay | Admitting: Obstetrics and Gynecology

## 2022-03-20 DIAGNOSIS — N6489 Other specified disorders of breast: Secondary | ICD-10-CM | POA: Diagnosis not present

## 2022-03-20 DIAGNOSIS — R928 Other abnormal and inconclusive findings on diagnostic imaging of breast: Secondary | ICD-10-CM

## 2022-03-20 DIAGNOSIS — R922 Inconclusive mammogram: Secondary | ICD-10-CM | POA: Diagnosis not present

## 2022-04-04 ENCOUNTER — Other Ambulatory Visit: Payer: 59

## 2022-04-11 ENCOUNTER — Other Ambulatory Visit: Payer: 59

## 2023-01-01 ENCOUNTER — Other Ambulatory Visit (HOSPITAL_BASED_OUTPATIENT_CLINIC_OR_DEPARTMENT_OTHER): Payer: Self-pay

## 2023-01-01 MED ORDER — COVID-19 MRNA VAC-TRIS(PFIZER) 30 MCG/0.3ML IM SUSY
0.3000 mL | PREFILLED_SYRINGE | Freq: Once | INTRAMUSCULAR | 0 refills | Status: AC
  Filled 2023-01-01: qty 0.3, 1d supply, fill #0

## 2023-01-07 DIAGNOSIS — L814 Other melanin hyperpigmentation: Secondary | ICD-10-CM | POA: Diagnosis not present

## 2023-01-07 DIAGNOSIS — D229 Melanocytic nevi, unspecified: Secondary | ICD-10-CM | POA: Diagnosis not present

## 2023-01-07 DIAGNOSIS — L821 Other seborrheic keratosis: Secondary | ICD-10-CM | POA: Diagnosis not present

## 2023-01-07 DIAGNOSIS — L578 Other skin changes due to chronic exposure to nonionizing radiation: Secondary | ICD-10-CM | POA: Diagnosis not present

## 2023-03-14 ENCOUNTER — Other Ambulatory Visit (HOSPITAL_BASED_OUTPATIENT_CLINIC_OR_DEPARTMENT_OTHER): Payer: Self-pay

## 2023-03-14 ENCOUNTER — Telehealth: Payer: 59 | Admitting: Physician Assistant

## 2023-03-14 DIAGNOSIS — B9689 Other specified bacterial agents as the cause of diseases classified elsewhere: Secondary | ICD-10-CM | POA: Diagnosis not present

## 2023-03-14 DIAGNOSIS — J019 Acute sinusitis, unspecified: Secondary | ICD-10-CM

## 2023-03-14 MED ORDER — AMOXICILLIN-POT CLAVULANATE 875-125 MG PO TABS
1.0000 | ORAL_TABLET | Freq: Two times a day (BID) | ORAL | 0 refills | Status: DC
  Filled 2023-03-14: qty 14, 7d supply, fill #0

## 2023-03-14 NOTE — Progress Notes (Signed)

## 2023-04-09 ENCOUNTER — Other Ambulatory Visit (HOSPITAL_BASED_OUTPATIENT_CLINIC_OR_DEPARTMENT_OTHER): Payer: Self-pay

## 2023-04-09 ENCOUNTER — Telehealth: Payer: Commercial Managed Care - PPO | Admitting: Physician Assistant

## 2023-04-09 DIAGNOSIS — Z1389 Encounter for screening for other disorder: Secondary | ICD-10-CM | POA: Diagnosis not present

## 2023-04-09 DIAGNOSIS — Z1231 Encounter for screening mammogram for malignant neoplasm of breast: Secondary | ICD-10-CM | POA: Diagnosis not present

## 2023-04-09 DIAGNOSIS — Z20828 Contact with and (suspected) exposure to other viral communicable diseases: Secondary | ICD-10-CM

## 2023-04-09 DIAGNOSIS — Z01419 Encounter for gynecological examination (general) (routine) without abnormal findings: Secondary | ICD-10-CM | POA: Diagnosis not present

## 2023-04-09 MED ORDER — OSELTAMIVIR PHOSPHATE 75 MG PO CAPS
75.0000 mg | ORAL_CAPSULE | Freq: Every day | ORAL | 0 refills | Status: DC
  Filled 2023-04-09: qty 10, 10d supply, fill #0

## 2023-04-09 NOTE — Progress Notes (Signed)
E visit for Flu like symptoms   We are sorry that you are not feeling well.  Here is how we plan to help! Based on what you have shared with me it looks like you may have possible exposure to a virus that causes influenza.  Influenza or "the flu" is   an infection caused by a respiratory virus. The flu virus is highly contagious and persons who did not receive their yearly flu vaccination may "catch" the flu from close contact.  We have anti-viral medications to treat the viruses that cause this infection. They are not a "cure" and only shorten the course of the infection. These prescriptions are most effective when they are given within the first 2 days of "flu" symptoms. Antiviral medication are indicated if you have a high risk of complications from the flu. You should  also consider an antiviral medication if you are in close contact with someone who is at risk. These medications can help patients avoid complications from the flu  but have side effects that you should know. Possible side effects from Tamiflu or oseltamivir include nausea, vomiting, diarrhea, dizziness, headaches, eye redness, sleep problems or other respiratory symptoms. You should not take Tamiflu if you have an allergy to oseltamivir or any to the ingredients in Tamiflu.  Based upon your symptoms and potential risk factors I have prescribed Oseltamivir (Tamiflu).  It has been sent to your designated pharmacy.  You will take one 75 mg capsule orally daily for the next 10 days. If symptoms develop please follow up for further treatment considerations.  ANYONE WHO HAS FLU SYMPTOMS SHOULD: Stay home. The flu is highly contagious and going out or to work exposes others! Be sure to drink plenty of fluids. Water is fine as well as fruit juices, sodas and electrolyte beverages. You may want to stay away from caffeine or alcohol. If you are nauseated, try taking small sips of liquids. How do you know if you are getting enough fluid? Your  urine should be a pale yellow or almost colorless. Get rest. Taking a steamy shower or using a humidifier may help nasal congestion and ease sore throat pain. Using a saline nasal spray works much the same way. Cough drops, hard candies and sore throat lozenges may ease your cough. Line up a caregiver. Have someone check on you regularly.   GET HELP RIGHT AWAY IF: You cannot keep down liquids or your medications. You become short of breath Your fell like you are going to pass out or loose consciousness. Your symptoms persist after you have completed your treatment plan MAKE SURE YOU  Understand these instructions. Will watch your condition. Will get help right away if you are not doing well or get worse.  Your e-visit answers were reviewed by a board certified advanced clinical practitioner to complete your personal care plan.  Depending on the condition, your plan could have included both over the counter or prescription medications.  If there is a problem please reply  once you have received a response from your provider.  Your safety is important to [place].  If you have drug allergies check your prescription carefully.    You can use MyChart to ask questions about today's visit, request a non-urgent call back, or ask for a work or school excuse for 24 hours related to this e-Visit. If it has been greater than 24 hours you will need to follow up with your provider, or enter a new e-Visit to address those concerns.  You will get an e-mail in the next two days asking about your experience.  I hope that your e-visit has been valuable and will speed your recovery. Thank you for using e-visits.   I have spent 5 minutes in review of e-visit questionnaire, review and updating patient chart, medical decision making and response to patient.   Margaretann Loveless, PA-C

## 2023-11-04 ENCOUNTER — Telehealth: Admitting: Physician Assistant

## 2023-11-04 DIAGNOSIS — J019 Acute sinusitis, unspecified: Secondary | ICD-10-CM | POA: Diagnosis not present

## 2023-11-04 DIAGNOSIS — B9689 Other specified bacterial agents as the cause of diseases classified elsewhere: Secondary | ICD-10-CM

## 2023-11-04 MED ORDER — AMOXICILLIN-POT CLAVULANATE 875-125 MG PO TABS: 1.0000 | ORAL_TABLET | Freq: Two times a day (BID) | ORAL | 0 refills | Status: DC

## 2023-11-04 NOTE — Progress Notes (Signed)

## 2023-11-10 NOTE — Progress Notes (Signed)
 I have spent 5 minutes in review of e-visit questionnaire, review and updating patient chart, medical decision making and response to patient.   Laure Kidney, PA-C

## 2023-12-16 ENCOUNTER — Telehealth: Admitting: Family Medicine

## 2023-12-16 ENCOUNTER — Other Ambulatory Visit (HOSPITAL_BASED_OUTPATIENT_CLINIC_OR_DEPARTMENT_OTHER): Payer: Self-pay

## 2023-12-16 DIAGNOSIS — R399 Unspecified symptoms and signs involving the genitourinary system: Secondary | ICD-10-CM | POA: Diagnosis not present

## 2023-12-16 MED ORDER — CEPHALEXIN 500 MG PO CAPS
500.0000 mg | ORAL_CAPSULE | Freq: Two times a day (BID) | ORAL | 0 refills | Status: AC
  Filled 2023-12-16: qty 14, 7d supply, fill #0

## 2023-12-16 NOTE — Progress Notes (Signed)

## 2023-12-24 ENCOUNTER — Other Ambulatory Visit (HOSPITAL_BASED_OUTPATIENT_CLINIC_OR_DEPARTMENT_OTHER): Payer: Self-pay

## 2023-12-24 MED ORDER — COMIRNATY 30 MCG/0.3ML IM SUSY
0.3000 mL | PREFILLED_SYRINGE | Freq: Once | INTRAMUSCULAR | 0 refills | Status: AC
  Filled 2023-12-24: qty 0.3, 1d supply, fill #0

## 2024-01-09 DIAGNOSIS — L814 Other melanin hyperpigmentation: Secondary | ICD-10-CM | POA: Diagnosis not present

## 2024-01-09 DIAGNOSIS — L821 Other seborrheic keratosis: Secondary | ICD-10-CM | POA: Diagnosis not present

## 2024-01-09 DIAGNOSIS — D1801 Hemangioma of skin and subcutaneous tissue: Secondary | ICD-10-CM | POA: Diagnosis not present

## 2024-01-09 DIAGNOSIS — D229 Melanocytic nevi, unspecified: Secondary | ICD-10-CM | POA: Diagnosis not present

## 2024-01-09 DIAGNOSIS — L578 Other skin changes due to chronic exposure to nonionizing radiation: Secondary | ICD-10-CM | POA: Diagnosis not present

## 2024-01-31 ENCOUNTER — Other Ambulatory Visit (HOSPITAL_BASED_OUTPATIENT_CLINIC_OR_DEPARTMENT_OTHER): Payer: Self-pay

## 2024-01-31 ENCOUNTER — Telehealth: Admitting: Physician Assistant

## 2024-01-31 DIAGNOSIS — J019 Acute sinusitis, unspecified: Secondary | ICD-10-CM | POA: Diagnosis not present

## 2024-01-31 DIAGNOSIS — B9689 Other specified bacterial agents as the cause of diseases classified elsewhere: Secondary | ICD-10-CM | POA: Diagnosis not present

## 2024-01-31 MED ORDER — AMOXICILLIN-POT CLAVULANATE 875-125 MG PO TABS
1.0000 | ORAL_TABLET | Freq: Two times a day (BID) | ORAL | 0 refills | Status: DC
  Filled 2024-01-31: qty 14, 7d supply, fill #0

## 2024-01-31 NOTE — Progress Notes (Signed)

## 2024-03-02 ENCOUNTER — Telehealth: Admitting: Family Medicine

## 2024-03-02 ENCOUNTER — Other Ambulatory Visit (HOSPITAL_BASED_OUTPATIENT_CLINIC_OR_DEPARTMENT_OTHER): Payer: Self-pay

## 2024-03-02 DIAGNOSIS — Z20828 Contact with and (suspected) exposure to other viral communicable diseases: Secondary | ICD-10-CM

## 2024-03-02 MED ORDER — OSELTAMIVIR PHOSPHATE 75 MG PO CAPS
75.0000 mg | ORAL_CAPSULE | Freq: Two times a day (BID) | ORAL | 0 refills | Status: AC
  Filled 2024-03-02: qty 10, 5d supply, fill #0

## 2024-03-02 NOTE — Progress Notes (Signed)
 E visit for Flu like symptoms   We are sorry that you are not feeling well.  Here is how we plan to help! Based on what you have shared with me it looks like you may have possible exposure to a virus that causes influenza.  Influenza or the flu is  an infection caused by a respiratory virus. The flu virus is highly contagious and persons who did not receive their yearly flu vaccination may catch the flu from close contact.  We have anti-viral medications to treat the viruses that cause this infection. They are not a cure and only shorten the course of the infection. These prescriptions are most effective when they are given within the first 2 days of flu symptoms. Antiviral medications are indicated if you have a high risk of complications from the flu. You should  also consider an antiviral medication if you are in close contact with someone who is at risk. These medications can help patients avoid complications from the flu but have side effects that you should know.   Possible side effects from Tamiflu  or oseltamivir  include nausea, vomiting, diarrhea, dizziness, headaches, eye redness, sleep problems or other respiratory symptoms. You should not take Tamiflu  if you have an allergy to oseltamivir  or any to the ingredients in Tamiflu .  Based upon your symptoms and potential risk factors I have prescribed Oseltamivir  (Tamiflu ).  It has been sent to your designated pharmacy.  You will take one 75 mg capsule orally twice a day for the next 5 days.   For nasal congestion, you may use an oral decongestant such as Mucinex D or if you have glaucoma or high blood pressure use plain Mucinex.  Saline nasal spray or nasal drops can help and can safely be used as often as needed for congestion.  If you have a sore or scratchy throat, use a saltwater gargle-  to  teaspoon of salt dissolved in a 4-ounce to 8-ounce glass of warm water.  Gargle the solution for approximately 15-30 seconds and then spit.   It is important not to swallow the solution.  You can also use throat lozenges/cough drops and Chloraseptic spray to help with throat pain or discomfort.  Warm or cold liquids can also be helpful in relieving throat pain.  For headache, pain or general discomfort, you can use Ibuprofen  or Tylenol  as directed.   Some authorities believe that zinc sprays or the use of Echinacea may shorten the course of your symptoms.   You are to isolate at home until you have been fever-free for at least 24 hours without a fever-reducing medication, and symptoms have been steadily improving for 24 hours.  If you must be around other household members who do not have symptoms, you need to make sure that both you and the family members are masking consistently with a high-quality mask.  If you note any worsening of symptoms despite treatment, please seek an in-person evaluation ASAP. If you note any significant shortness of breath or any chest pain, please seek ED evaluation. Please do not delay care!  ANYONE WHO HAS FLU SYMPTOMS SHOULD: Stay home. The flu is highly contagious and going out or to work exposes others! Be sure to drink plenty of fluids. Water is fine as well as fruit juices, sodas and electrolyte beverages. You may want to stay away from caffeine or alcohol. If you are nauseated, try taking small sips of liquids. How do you know if you are getting enough fluid? Your urine should  be a pale yellow or almost colorless. Get rest. Taking a steamy shower or using a humidifier may help nasal congestion and ease sore throat pain. Using a saline nasal spray works much the same way. Cough drops, hard candies and sore throat lozenges may ease your cough. Line up a caregiver. Have someone check on you regularly.  GET HELP RIGHT AWAY IF: You cannot keep down liquids or your medications. You become short of breath Your fell like you are going to pass out or loose consciousness. Your symptoms persist after you  have completed your treatment plan  MAKE SURE YOU  Understand these instructions. Will watch your condition. Will get help right away if you are not doing well or get worse.  Your e-visit answers were reviewed by a board certified advanced clinical practitioner to complete your personal care plan.  Depending on the condition, your plan could have included both over the counter or prescription medications.  If there is a problem please reply  once you have received a response from your provider.  Your safety is important to us .  If you have drug allergies check your prescription carefully.    You can use MyChart to ask questions about todays visit, request a non-urgent call back, or ask for a work or school excuse for 24 hours related to this e-Visit. If it has been greater than 24 hours you will need to follow up with your provider, or enter a new e-Visit to address those concerns.  You will get an e-mail in the next two days asking about your experience.  I hope that your e-visit has been valuable and will speed your recovery. Thank you for using e-visits.   I have spent 5 minutes in review of e-visit questionnaire, review and updating patient chart, medical decision making and response to patient.   Chiquita CHRISTELLA Barefoot, NP

## 2024-03-14 ENCOUNTER — Telehealth: Admitting: Nurse Practitioner

## 2024-03-14 ENCOUNTER — Other Ambulatory Visit (HOSPITAL_BASED_OUTPATIENT_CLINIC_OR_DEPARTMENT_OTHER): Payer: Self-pay

## 2024-03-14 DIAGNOSIS — J019 Acute sinusitis, unspecified: Secondary | ICD-10-CM

## 2024-03-14 DIAGNOSIS — B9689 Other specified bacterial agents as the cause of diseases classified elsewhere: Secondary | ICD-10-CM | POA: Diagnosis not present

## 2024-03-14 MED ORDER — AMOXICILLIN-POT CLAVULANATE 875-125 MG PO TABS
1.0000 | ORAL_TABLET | Freq: Two times a day (BID) | ORAL | 0 refills | Status: AC
  Filled 2024-03-14: qty 14, 7d supply, fill #0

## 2024-03-14 NOTE — Progress Notes (Signed)
 Hello!!!  We noticed this is your 3rd time being treated for bacterial sinusitis within 6 months. We would like to make you aware that this would be considered chronic sinusitis and would need to be evaluated by an ENT if you experience any additional symptoms of sinusitis over the next 3 months. We hope you fell better soon and have sent the following treatment today:       E-Visit for Sinus Problems  We are sorry that you are not feeling well.  Here is how we plan to help!  Based on what you have shared with me it looks like you have sinusitis.  Sinusitis is inflammation and infection in the sinus cavities of the head.  Based on your presentation I believe you most likely have Acute Bacterial Sinusitis.  This is an infection caused by bacteria and is treated with antibiotics. I have prescribed Augmentin  875mg /125mg  one tablet twice daily with food, for 7 days. You may use an oral decongestant such as Mucinex D or if you have glaucoma or high blood pressure use plain Mucinex. Saline nasal spray help and can safely be used as often as needed for congestion.  If you develop worsening sinus pain, fever or notice severe headache and vision changes, or if symptoms are not better after completion of antibiotic, please schedule an appointment with a health care provider.    Sinus infections are not as easily transmitted as other respiratory infection, however we still recommend that you avoid close contact with loved ones, especially the very young and elderly.  Remember to wash your hands thoroughly throughout the day as this is the number one way to prevent the spread of infection!  Home Care: Only take medications as instructed by your medical team. Complete the entire course of an antibiotic. Do not take these medications with alcohol. A steam or ultrasonic humidifier can help congestion.  You can place a towel over your head and breathe in the steam from hot water coming from a faucet. Avoid close  contacts especially the very young and the elderly. Cover your mouth when you cough or sneeze. Always remember to wash your hands.  Get Help Right Away If: You develop worsening fever or sinus pain. You develop a severe head ache or visual changes. Your symptoms persist after you have completed your treatment plan.  Make sure you Understand these instructions. Will watch your condition. Will get help right away if you are not doing well or get worse.  Your e-visit answers were reviewed by a board certified advanced clinical practitioner to complete your personal care plan.  Depending on the condition, your plan could have included both over the counter or prescription medications.  If there is a problem please reply  once you have received a response from your provider.  Your safety is important to us .  If you have drug allergies check your prescription carefully.    You can use MyChart to ask questions about todays visit, request a non-urgent call back, or ask for a work or school excuse for 24 hours related to this e-Visit. If it has been greater than 24 hours you will need to follow up with your provider, or enter a new e-Visit to address those concerns.  You will get an e-mail in the next two days asking about your experience.  I hope that your e-visit has been valuable and will speed your recovery. Thank you for using e-visits.  I have spent 5 minutes in review of e-visit  questionnaire, review and updating patient chart, medical decision making and response to patient.   Kairav Russomanno W Ziza Hastings, NP
